# Patient Record
Sex: Male | Born: 1945
Health system: Southern US, Community
[De-identification: ages and names within clinical notes are randomized; demographics above are authoritative.]

## PROBLEM LIST (undated history)

## (undated) DIAGNOSIS — L509 Urticaria, unspecified: Secondary | ICD-10-CM

## (undated) DIAGNOSIS — E079 Disorder of thyroid, unspecified: Secondary | ICD-10-CM

## (undated) DIAGNOSIS — I10 Essential (primary) hypertension: Secondary | ICD-10-CM

## (undated) DIAGNOSIS — K219 Gastro-esophageal reflux disease without esophagitis: Secondary | ICD-10-CM

## (undated) DIAGNOSIS — F1910 Other psychoactive substance abuse, uncomplicated: Secondary | ICD-10-CM

## (undated) HISTORY — DX: Gastro-esophageal reflux disease without esophagitis: K21.9

## (undated) HISTORY — DX: Urticaria, unspecified: L50.9

## (undated) HISTORY — PX: SHOULDER ARTHROSCOPY: SHX128

## (undated) HISTORY — PX: JOINT REPLACEMENT: SHX530

## (undated) HISTORY — DX: Other psychoactive substance abuse, uncomplicated: F19.10

## (undated) HISTORY — PX: TENDON REPAIR: SHX5111

## (undated) HISTORY — PX: CYST EXCISION: SHX5701

## (undated) HISTORY — PX: TONSILLECTOMY: SUR1361

## (undated) HISTORY — PX: ADENOIDECTOMY: SUR15

---

## 1988-03-24 HISTORY — PX: KNEE ARTHROSCOPY: SUR90

## 2000-05-20 ENCOUNTER — Ambulatory Visit (HOSPITAL_COMMUNITY): Admission: RE | Admit: 2000-05-20 | Discharge: 2000-05-20 | Payer: Self-pay | Admitting: Neurology

## 2000-07-07 ENCOUNTER — Ambulatory Visit (HOSPITAL_COMMUNITY): Admission: RE | Admit: 2000-07-07 | Discharge: 2000-07-07 | Payer: Self-pay | Admitting: Neurology

## 2000-07-07 ENCOUNTER — Encounter: Payer: Self-pay | Admitting: Neurology

## 2000-08-24 ENCOUNTER — Ambulatory Visit (HOSPITAL_COMMUNITY): Admission: RE | Admit: 2000-08-24 | Discharge: 2000-08-24 | Payer: Self-pay | Admitting: Pediatrics

## 2000-08-24 ENCOUNTER — Encounter: Payer: Self-pay | Admitting: Pediatrics

## 2001-10-05 ENCOUNTER — Ambulatory Visit (HOSPITAL_COMMUNITY): Admission: RE | Admit: 2001-10-05 | Discharge: 2001-10-05 | Payer: Self-pay | Admitting: Pediatrics

## 2001-10-05 ENCOUNTER — Encounter: Payer: Self-pay | Admitting: Pediatrics

## 2002-03-24 HISTORY — PX: OTHER SURGICAL HISTORY: SHX169

## 2002-07-07 ENCOUNTER — Encounter: Payer: Self-pay | Admitting: Pediatrics

## 2002-07-07 ENCOUNTER — Ambulatory Visit (HOSPITAL_COMMUNITY): Admission: RE | Admit: 2002-07-07 | Discharge: 2002-07-07 | Payer: Self-pay | Admitting: Pediatrics

## 2002-07-08 ENCOUNTER — Encounter: Payer: Self-pay | Admitting: Pediatrics

## 2002-07-08 ENCOUNTER — Ambulatory Visit (HOSPITAL_COMMUNITY): Admission: RE | Admit: 2002-07-08 | Discharge: 2002-07-08 | Payer: Self-pay | Admitting: Pediatrics

## 2002-08-01 ENCOUNTER — Observation Stay (HOSPITAL_COMMUNITY): Admission: RE | Admit: 2002-08-01 | Discharge: 2002-08-02 | Payer: Self-pay | Admitting: General Surgery

## 2015-07-24 ENCOUNTER — Other Ambulatory Visit (HOSPITAL_COMMUNITY): Payer: Self-pay | Admitting: Family Medicine

## 2015-09-17 DIAGNOSIS — R300 Dysuria: Secondary | ICD-10-CM | POA: Diagnosis not present

## 2015-10-01 DIAGNOSIS — R300 Dysuria: Secondary | ICD-10-CM | POA: Diagnosis not present

## 2015-10-01 DIAGNOSIS — R3129 Other microscopic hematuria: Secondary | ICD-10-CM | POA: Diagnosis not present

## 2015-10-01 DIAGNOSIS — R358 Other polyuria: Secondary | ICD-10-CM | POA: Diagnosis not present

## 2015-10-19 DIAGNOSIS — N289 Disorder of kidney and ureter, unspecified: Secondary | ICD-10-CM | POA: Diagnosis not present

## 2015-10-19 DIAGNOSIS — R3129 Other microscopic hematuria: Secondary | ICD-10-CM | POA: Diagnosis not present

## 2015-10-19 DIAGNOSIS — K8020 Calculus of gallbladder without cholecystitis without obstruction: Secondary | ICD-10-CM | POA: Diagnosis not present

## 2015-11-19 DIAGNOSIS — M25512 Pain in left shoulder: Secondary | ICD-10-CM | POA: Diagnosis not present

## 2015-12-07 DIAGNOSIS — N419 Inflammatory disease of prostate, unspecified: Secondary | ICD-10-CM | POA: Diagnosis not present

## 2015-12-07 DIAGNOSIS — R300 Dysuria: Secondary | ICD-10-CM | POA: Diagnosis not present

## 2015-12-07 DIAGNOSIS — T387X5D Adverse effect of androgens and anabolic congeners, subsequent encounter: Secondary | ICD-10-CM | POA: Diagnosis not present

## 2015-12-07 DIAGNOSIS — R358 Other polyuria: Secondary | ICD-10-CM | POA: Diagnosis not present

## 2015-12-07 DIAGNOSIS — N138 Other obstructive and reflux uropathy: Secondary | ICD-10-CM | POA: Diagnosis not present

## 2015-12-07 DIAGNOSIS — N401 Enlarged prostate with lower urinary tract symptoms: Secondary | ICD-10-CM | POA: Diagnosis not present

## 2015-12-07 DIAGNOSIS — E291 Testicular hypofunction: Secondary | ICD-10-CM | POA: Diagnosis not present

## 2015-12-15 DIAGNOSIS — M25512 Pain in left shoulder: Secondary | ICD-10-CM | POA: Diagnosis not present

## 2016-01-11 DIAGNOSIS — R69 Illness, unspecified: Secondary | ICD-10-CM | POA: Diagnosis not present

## 2016-05-19 DIAGNOSIS — M19012 Primary osteoarthritis, left shoulder: Secondary | ICD-10-CM | POA: Diagnosis not present

## 2016-07-22 DIAGNOSIS — M170 Bilateral primary osteoarthritis of knee: Secondary | ICD-10-CM | POA: Diagnosis not present

## 2016-07-30 DIAGNOSIS — L723 Sebaceous cyst: Secondary | ICD-10-CM | POA: Diagnosis not present

## 2016-07-30 DIAGNOSIS — L820 Inflamed seborrheic keratosis: Secondary | ICD-10-CM | POA: Diagnosis not present

## 2016-11-16 DIAGNOSIS — M5416 Radiculopathy, lumbar region: Secondary | ICD-10-CM | POA: Diagnosis not present

## 2016-11-18 DIAGNOSIS — M170 Bilateral primary osteoarthritis of knee: Secondary | ICD-10-CM | POA: Diagnosis not present

## 2016-11-19 DIAGNOSIS — M5416 Radiculopathy, lumbar region: Secondary | ICD-10-CM | POA: Diagnosis not present

## 2016-11-29 DIAGNOSIS — M545 Low back pain: Secondary | ICD-10-CM | POA: Diagnosis not present

## 2016-12-08 DIAGNOSIS — M5416 Radiculopathy, lumbar region: Secondary | ICD-10-CM | POA: Diagnosis not present

## 2016-12-08 DIAGNOSIS — M1712 Unilateral primary osteoarthritis, left knee: Secondary | ICD-10-CM | POA: Diagnosis not present

## 2017-01-02 DIAGNOSIS — M79645 Pain in left finger(s): Secondary | ICD-10-CM | POA: Diagnosis not present

## 2017-01-02 DIAGNOSIS — M1812 Unilateral primary osteoarthritis of first carpometacarpal joint, left hand: Secondary | ICD-10-CM | POA: Diagnosis not present

## 2017-01-09 DIAGNOSIS — M1812 Unilateral primary osteoarthritis of first carpometacarpal joint, left hand: Secondary | ICD-10-CM | POA: Diagnosis not present

## 2017-01-09 DIAGNOSIS — M79645 Pain in left finger(s): Secondary | ICD-10-CM | POA: Diagnosis not present

## 2017-02-09 DIAGNOSIS — M19012 Primary osteoarthritis, left shoulder: Secondary | ICD-10-CM | POA: Diagnosis not present

## 2017-03-15 DIAGNOSIS — M1712 Unilateral primary osteoarthritis, left knee: Secondary | ICD-10-CM | POA: Diagnosis not present

## 2017-03-31 DIAGNOSIS — M170 Bilateral primary osteoarthritis of knee: Secondary | ICD-10-CM | POA: Diagnosis not present

## 2017-04-27 ENCOUNTER — Encounter (HOSPITAL_COMMUNITY): Payer: Self-pay | Admitting: Emergency Medicine

## 2017-04-27 ENCOUNTER — Ambulatory Visit (INDEPENDENT_AMBULATORY_CARE_PROVIDER_SITE_OTHER): Payer: Medicare HMO

## 2017-04-27 ENCOUNTER — Other Ambulatory Visit: Payer: Self-pay

## 2017-04-27 ENCOUNTER — Ambulatory Visit (HOSPITAL_COMMUNITY)
Admission: EM | Admit: 2017-04-27 | Discharge: 2017-04-27 | Disposition: A | Discharge status: Home / Self Care | Payer: Medicare HMO | Attending: Family Medicine | Admitting: Family Medicine

## 2017-04-27 DIAGNOSIS — S20211A Contusion of right front wall of thorax, initial encounter: Secondary | ICD-10-CM

## 2017-04-27 DIAGNOSIS — S2231XA Fracture of one rib, right side, initial encounter for closed fracture: Secondary | ICD-10-CM | POA: Diagnosis not present

## 2017-04-27 DIAGNOSIS — W19XXXD Unspecified fall, subsequent encounter: Secondary | ICD-10-CM | POA: Diagnosis not present

## 2017-04-27 HISTORY — DX: Essential (primary) hypertension: I10

## 2017-04-27 HISTORY — DX: Disorder of thyroid, unspecified: E07.9

## 2017-04-27 NOTE — ED Triage Notes (Signed)
The patient presented to the [facility] with a complaint of right lateral posterior rib cage pain secondary to a fall x 3 weeks ago.

## 2017-04-27 NOTE — ED Provider Notes (Signed)
  [place]   [id number] 04/27/17 Arrival Time: 1005  ASSESSMENT & PLAN:  1. Rib contusion, right, initial encounter   2. Closed fracture of one rib of right side, initial encounter    Imaging: Dg Ribs Unilateral W/chest Right  Result Date: 04/27/2017 CLINICAL DATA:  Fall in shower 3 weeks ago EXAM: RIGHT RIBS AND CHEST - 3+ VIEW COMPARISON:  12/15/2015 FINDINGS: Right lateral 8th rib fracture noted. No effusion or pneumothorax. Heart is normal size. Lungs are clear. IMPRESSION: Right lateral 8th rib fracture.  No pneumothorax. Electronically Signed   By: Rolm Baptise M.D.   On: 04/27/2017 10:43   He reports adquate pain control with OTC analgesics. Should continue to improve over the next 2-3 weeks.  May f/u as needed.  Reviewed expectations re: course of current medical issues. Questions answered. Outlined signs and symptoms indicating need for more acute intervention. Patient verbalized understanding. After Visit Summary given.  SUBJECTIVE: History from: patient. ILYA NEELY is a 72 y.o. male who reports:  Reports  localized pain of his right posterior ribcage; persistent; described as aching and sharp without radiation Onset: abrupt, 3 weeks ago Injury/trama: yes, fall onto side of tub hitting R posterior ribs Severity: moderate Progression: stable Relieved by: OTC ibuprofen Worsened by: certain movements Associated symptoms: "feels a little swollen over the place I hit" Extremity sensation changes or weakness: none Self treatment: tried OTCs with relief of pain No SOB.  ROS: As per HPI.   OBJECTIVE:  Vitals:   04/27/17 1020  BP: (!) 164/85  Pulse: 84  Resp: 18  Temp: 98.7 F (37.1 C)  TempSrc: Oral  SpO2: 100%    General appearance: alert; no distress Extremities: no cyanosis or edema CV: normal extremity capillary refill Chest wall: tender over lower R post ribs; no gross abn; no bruising Skin: warm and dry Neurologic: normal  gait Psychological: alert and cooperative; normal mood and affect  Allergies  Allergen Reactions  . Codeine Other (See Comments)    Syncope    Past Medical History:  Diagnosis Date  . Hypertension   . Thyroid disease    Social History   Socioeconomic History  . Marital status: Single    Spouse name: Not on file  . Number of children: Not on file  . Years of education: Not on file  . Highest education level: Not on file  Social Needs  . Financial resource strain: Not on file  . Food insecurity - worry: Not on file  . Food insecurity - inability: Not on file  . Transportation needs - medical: Not on file  . Transportation needs - non-medical: Not on file  Occupational History  . Not on file  Tobacco Use  . Smoking status: Former Research scientist (life sciences)  . Smokeless tobacco: Never Used  Substance and Sexual Activity  . Alcohol use: No    Frequency: Never  . Drug use: No  . Sexual activity: Not on file  Other Topics Concern  . Not on file  Social History Narrative  . Not on file   History reviewed. No pertinent family history. Past Surgical History:  Procedure Laterality Date  . CYST EXCISION    . JOINT REPLACEMENT    . SHOULDER ARTHROSCOPY    . TENDON REPAIR    . Evonnie Dawes, MD 04/27/17 1128

## 2017-06-10 DIAGNOSIS — M25511 Pain in right shoulder: Secondary | ICD-10-CM | POA: Diagnosis not present

## 2017-07-31 DIAGNOSIS — M542 Cervicalgia: Secondary | ICD-10-CM | POA: Diagnosis not present

## 2017-07-31 DIAGNOSIS — M25511 Pain in right shoulder: Secondary | ICD-10-CM | POA: Diagnosis not present

## 2017-08-11 DIAGNOSIS — M170 Bilateral primary osteoarthritis of knee: Secondary | ICD-10-CM | POA: Diagnosis not present

## 2017-08-20 ENCOUNTER — Other Ambulatory Visit: Payer: Self-pay

## 2017-08-20 ENCOUNTER — Encounter: Payer: Self-pay | Admitting: Emergency Medicine

## 2017-08-20 ENCOUNTER — Emergency Department
Admission: EM | Admit: 2017-08-20 | Discharge: 2017-08-20 | Disposition: A | Discharge status: Home / Self Care | Payer: Medicare HMO | Source: Home / Self Care

## 2017-08-20 DIAGNOSIS — W57XXXA Bitten or stung by nonvenomous insect and other nonvenomous arthropods, initial encounter: Secondary | ICD-10-CM | POA: Diagnosis not present

## 2017-08-20 DIAGNOSIS — L03115 Cellulitis of right lower limb: Secondary | ICD-10-CM | POA: Diagnosis not present

## 2017-08-20 MED ORDER — DOXYCYCLINE HYCLATE 100 MG PO TABS: 100.0000 mg | ORAL_TABLET | Freq: Two times a day (BID) | ORAL | 0 refills | Status: DC

## 2017-08-20 NOTE — ED Provider Notes (Signed)
[place]   [id number] 08/20/17 Arrival Time: 7   SUBJECTIVE:  SYNCERE EBLE is a 72 y.o. male who presents to the urgent care with complaint of Tick bite base of skull, bite on top of head, skin problem on Right knee x 2 weeks. Headache this am, resolved No other rash or arthralgia Using neosporin and bacitracin on the abrasion on the right knee that just hasn't healed and is more itchy than painful.  Works in psychiatry downstairs.  Past Medical History:  Diagnosis Date  . Hypertension   . Thyroid disease    Family History  Problem Relation Age of Onset  . Cancer Father   . Diabetes Father   . Hypertension Father   . Heart attack Father    Social History   Socioeconomic History  . Marital status: Single    Spouse name: Not on file  . Number of children: Not on file  . Years of education: Not on file  . Highest education level: Not on file  Occupational History  . Not on file  Social Needs  . Financial resource strain: Not on file  . Food insecurity:    Worry: Not on file    Inability: Not on file  . Transportation needs:    Medical: Not on file    Non-medical: Not on file  Tobacco Use  . Smoking status: Former Research scientist (life sciences)  . Smokeless tobacco: Never Used  Substance and Sexual Activity  . Alcohol use: No    Frequency: Never  . Drug use: No  . Sexual activity: Not on file  Lifestyle  . Physical activity:    Days per week: Not on file    Minutes per session: Not on file  . Stress: Not on file  Relationships  . Social connections:    Talks on phone: Not on file    Gets together: Not on file    Attends religious service: Not on file    Active member of club or organization: Not on file    Attends meetings of clubs or organizations: Not on file    Relationship status: Not on file  . Intimate partner violence:    Fear of current or ex partner: Not on file    Emotionally abused: Not on file    Physically abused: Not on file    Forced sexual  activity: Not on file  Other Topics Concern  . Not on file  Social History Narrative  . Not on file   No outpatient medications have been marked as taking for the 08/20/17 encounter [facility] Encounter).   Allergies  Allergen Reactions  . Codeine Other (See Comments)    Syncope      ROS: As per HPI, remainder of ROS negative.   OBJECTIVE:   Vitals:   08/20/17 1251 08/20/17 1253  BP: (!) 168/89   Pulse: 77   Temp: 98.5 F (36.9 C)   TempSrc: Oral   SpO2: 96%   Weight:  211 lb (95.7 kg)  Height:  5\' 11"  (1.803 m)     General appearance: alert; no distress Eyes: PERRL; EOMI; conjunctiva normal HENT: normocephalic; atraumatic; TMs normal, canal normal, external ears normal without trauma; nasal mucosa normal; oral mucosa normal Neck: supple Extremities: no cyanosis or edema; symmetrical with no gross deformities Skin: warm and dry; crusty 1/2 cm right occipital nodule where the tick was removed. The right knee has a central yellow eschar of 2 x 3 mm with 1 cm surrounding erythema  Neurologic: normal gait; grossly normal Psychological: alert and cooperative; normal mood and affect       ASSESSMENT & PLAN:  1. Tick bite, initial encounter   2. Cellulitis of right lower extremity     Meds ordered this encounter  Medications  . doxycycline (VIBRA-TABS) 100 MG tablet    Sig: Take 1 tablet (100 mg total) by mouth 2 (two) times daily.    Dispense:  20 tablet    Refill:  0    Reviewed expectations re: course of current medical issues. Questions answered. Outlined signs and symptoms indicating need for more acute intervention. Patient verbalized understanding. After Visit Summary given.     Robyn Haber, MD 08/20/17 1340

## 2017-08-20 NOTE — ED Triage Notes (Addendum)
Tick bite base of skull, bite on top of head, skin problem on Right knee x 2 days Headache this am, resolved

## 2017-08-31 DIAGNOSIS — M25511 Pain in right shoulder: Secondary | ICD-10-CM | POA: Diagnosis not present

## 2017-08-31 DIAGNOSIS — M542 Cervicalgia: Secondary | ICD-10-CM | POA: Diagnosis not present

## 2017-09-04 DIAGNOSIS — M542 Cervicalgia: Secondary | ICD-10-CM | POA: Diagnosis not present

## 2017-10-06 DIAGNOSIS — M170 Bilateral primary osteoarthritis of knee: Secondary | ICD-10-CM | POA: Diagnosis not present

## 2017-10-20 DIAGNOSIS — M542 Cervicalgia: Secondary | ICD-10-CM | POA: Diagnosis not present

## 2017-10-20 DIAGNOSIS — M5412 Radiculopathy, cervical region: Secondary | ICD-10-CM | POA: Diagnosis not present

## 2017-10-30 DIAGNOSIS — M5412 Radiculopathy, cervical region: Secondary | ICD-10-CM | POA: Diagnosis not present

## 2017-10-30 DIAGNOSIS — M542 Cervicalgia: Secondary | ICD-10-CM | POA: Diagnosis not present

## 2017-11-08 ENCOUNTER — Ambulatory Visit (HOSPITAL_COMMUNITY)
Admission: EM | Admit: 2017-11-08 | Discharge: 2017-11-08 | Disposition: A | Discharge status: Home / Self Care | Payer: Medicare HMO | Attending: Family Medicine | Admitting: Family Medicine

## 2017-11-08 ENCOUNTER — Encounter (HOSPITAL_COMMUNITY): Payer: Self-pay

## 2017-11-08 DIAGNOSIS — L089 Local infection of the skin and subcutaneous tissue, unspecified: Secondary | ICD-10-CM

## 2017-11-08 DIAGNOSIS — L723 Sebaceous cyst: Secondary | ICD-10-CM | POA: Diagnosis not present

## 2017-11-08 MED ORDER — LIDOCAINE-EPINEPHRINE (PF) 2 %-1:200000 IJ SOLN
INTRAMUSCULAR | Status: AC
  Filled 2017-11-08: qty 20

## 2017-11-08 MED ORDER — DOXYCYCLINE HYCLATE 100 MG PO TABS: 100.0000 mg | ORAL_TABLET | Freq: Two times a day (BID) | ORAL | 0 refills | Status: DC

## 2017-11-08 NOTE — ED Triage Notes (Signed)
Pt presents with abscess on back of right shoulder area.

## 2017-11-08 NOTE — Discharge Instructions (Signed)
Warm compresses to area Change dressing daily Remove wick in a couple days Take antibiotics as directed Return as needed

## 2017-11-08 NOTE — ED Provider Notes (Signed)
[name]    CSN: [id number] Arrival date & time: 11/08/17  1544     History   Chief Complaint Chief Complaint  Patient presents with  . Abscess    right shoulder area on back     HPI Daniel Marquez is a 72 y.o. male.   HPI  Patient has a known swollen cyst on the right shoulder area.  It is become more more swollen and painful.  Now he thinks there is an abscess because it surrounded by red skin and very painful.  No fever.  Past Medical History:  Diagnosis Date  . Hypertension   . Thyroid disease     There are no active problems to display for this patient.   Past Surgical History:  Procedure Laterality Date  . CYST EXCISION    . JOINT REPLACEMENT    . SHOULDER ARTHROSCOPY    . TENDON REPAIR    . TONSILLECTOMY         Home Medications    Prior to Admission medications   Medication Sig Start Date End Date Taking? Authorizing Provider  buprenorphine (SUBUTEX) 8 MG SUBL SL tablet Place under the tongue daily.    [provider]  doxycycline (VIBRA-TABS) 100 MG tablet Take 1 tablet (100 mg total) by mouth 2 (two) times daily. 11/08/17   Raylene Everts, MD  hydrALAZINE (APRESOLINE) 10 MG tablet Take 10 mg by mouth 3 (three) times daily.    [provider]  levothyroxine (SYNTHROID, LEVOTHROID) 25 MCG tablet Take 25 mcg by mouth daily before breakfast.    [provider]  lisinopril (PRINIVIL,ZESTRIL) 10 MG tablet Take 10 mg by mouth daily.    [provider]    Family History Family History  Problem Relation Age of Onset  . Cancer Father   . Diabetes Father   . Hypertension Father   . Heart attack Father     Social History Social History   Tobacco Use  . Smoking status: Former Research scientist (life sciences)  . Smokeless tobacco: Never Used  Substance Use Topics  . Alcohol use: No    Frequency: Never  . Drug use: No     Allergies   Codeine   Review of Systems Review of Systems  Constitutional: Negative for  chills and fever.  HENT: Negative for ear pain and sore throat.   Eyes: Negative for pain and visual disturbance.  Respiratory: Negative for cough and shortness of breath.   Cardiovascular: Negative for chest pain and palpitations.  Gastrointestinal: Negative for abdominal pain and vomiting.  Genitourinary: Negative for dysuria and hematuria.  Musculoskeletal: Negative for arthralgias and back pain.  Skin: Positive for wound. Negative for color change and rash.  Neurological: Negative for seizures and syncope.  All other systems reviewed and are negative.    Physical Exam Triage Vital Signs ED Triage Vitals  Enc Vitals Group     BP 11/08/17 1555 137/71     Pulse Rate 11/08/17 1555 84     Resp 11/08/17 1555 20     Temp 11/08/17 1555 98 F (36.7 C)     Temp Source 11/08/17 1555 Temporal     SpO2 11/08/17 1555 97 %     Weight --      Height --      Head Circumference --      Peak Flow --      Pain Score 11/08/17 1554 8     Pain Loc --  Pain Edu? --      Excl. in [place]? --    No data found.  Updated Vital Signs BP 137/71 (BP Location: Left Arm)   Pulse 84   Temp 98 F (36.7 C) (Temporal)   Resp 20   SpO2 97%   Visual Acuity Right Eye Distance:   Left Eye Distance:   Bilateral Distance:    Right Eye Near:   Left Eye Near:    Bilateral Near:     Physical Exam  Constitutional: He appears well-developed and well-nourished. No distress.  HENT:  Head: Normocephalic and atraumatic.  Mouth/Throat: Oropharynx is clear and moist.  Eyes: Pupils are equal, round, and reactive to light. Conjunctivae are normal.  Neck: Normal range of motion.  Cardiovascular: Normal rate.  Pulmonary/Chest: Effort normal. No respiratory distress.  Abdominal: Soft. He exhibits no distension.  Musculoskeletal: Normal range of motion. He exhibits no edema.       Back:  Neurological: He is alert.  Skin: Skin is warm and dry.  Psychiatric: He has a normal mood and affect. His behavior is  normal.     UC Treatments / Results  Labs (all labs ordered are listed, but only abnormal results are displayed) Labs Reviewed - No data to display  EKG None  Radiology No results found.  Procedures Incision and Drainage Date/Time: 11/08/2017 7:55 PM Performed by: Raylene Everts, MD Authorized by: Raylene Everts, MD   Consent:    Consent obtained:  Verbal   Consent given by:  Patient   Risks discussed:  Bleeding, incomplete drainage and infection   Alternatives discussed:  No treatment Location:    Type:  Abscess   Size:  4 cm   Location:  Trunk   Trunk location:  Back Pre-procedure details:    Skin preparation:  Betadine Anesthesia (see MAR for exact dosages):    Anesthesia method:  Local infiltration   Local anesthetic:  Lidocaine 2% WITH epi Procedure type:    Complexity:  Complex Procedure details:    Needle aspiration: no     Incision types:  Stab incision   Incision depth:  Subcutaneous   Scalpel blade:  11   Wound management:  Probed and deloculated and irrigated with saline   Drainage:  Purulent   Drainage amount:  Copious   Wound treatment:  Drain placed   Packing materials:  1/4 in gauze Post-procedure details:    Patient tolerance of procedure:  Tolerated well, no immediate complications Comments:     Moderate purulence expressed, also moderate see above.  Walls of cyst were visible and were also debrided away.  Gated and then packed with 8 inches of quarter inch gauze   (including critical care time)  Medications Ordered in UC Medications - No data to display  Initial Impression / Assessment and Plan / UC Course  I have reviewed the triage vital signs and the nursing notes.  Pertinent labs & imaging results that were available during my care of the patient were reviewed by me and considered in my medical decision making (see chart for details).     Infected sebaceous cyst.  Discussed care with patient.  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  He can come back here for any difficulty. Final Clinical Impressions(s) / UC Diagnoses   Final diagnoses:  Infected sebaceous cyst of skin     Discharge Instructions     Warm compresses to area Change dressing daily  Remove wick in a couple days Take antibiotics as directed Return as needed   ED Prescriptions    Medication Sig Dispense Auth. Provider   doxycycline (VIBRA-TABS) 100 MG tablet Take 1 tablet (100 mg total) by mouth 2 (two) times daily. 20 tablet Raylene Everts, MD     Controlled Substance Prescriptions [place] Controlled Substance Registry consulted? Not Applicable   Raylene Everts, MD 11/08/17 [phone]

## 2017-11-17 DIAGNOSIS — M4316 Spondylolisthesis, lumbar region: Secondary | ICD-10-CM | POA: Diagnosis not present

## 2017-11-17 DIAGNOSIS — M545 Low back pain: Secondary | ICD-10-CM | POA: Diagnosis not present

## 2017-11-17 DIAGNOSIS — M5416 Radiculopathy, lumbar region: Secondary | ICD-10-CM | POA: Diagnosis not present

## 2017-11-27 DIAGNOSIS — M4316 Spondylolisthesis, lumbar region: Secondary | ICD-10-CM | POA: Diagnosis not present

## 2017-11-27 DIAGNOSIS — M5416 Radiculopathy, lumbar region: Secondary | ICD-10-CM | POA: Diagnosis not present

## 2017-11-27 DIAGNOSIS — M545 Low back pain: Secondary | ICD-10-CM | POA: Diagnosis not present

## 2017-12-08 DIAGNOSIS — R69 Illness, unspecified: Secondary | ICD-10-CM | POA: Diagnosis not present

## 2017-12-18 DIAGNOSIS — M48062 Spinal stenosis, lumbar region with neurogenic claudication: Secondary | ICD-10-CM | POA: Insufficient documentation

## 2017-12-18 DIAGNOSIS — M4180 Other forms of scoliosis, site unspecified: Secondary | ICD-10-CM | POA: Insufficient documentation

## 2017-12-22 DIAGNOSIS — M5416 Radiculopathy, lumbar region: Secondary | ICD-10-CM | POA: Diagnosis not present

## 2017-12-22 DIAGNOSIS — M4316 Spondylolisthesis, lumbar region: Secondary | ICD-10-CM | POA: Diagnosis not present

## 2017-12-22 DIAGNOSIS — M5412 Radiculopathy, cervical region: Secondary | ICD-10-CM | POA: Diagnosis not present

## 2018-01-08 DIAGNOSIS — M4316 Spondylolisthesis, lumbar region: Secondary | ICD-10-CM | POA: Diagnosis not present

## 2018-01-08 DIAGNOSIS — M545 Low back pain: Secondary | ICD-10-CM | POA: Diagnosis not present

## 2018-01-08 DIAGNOSIS — M5416 Radiculopathy, lumbar region: Secondary | ICD-10-CM | POA: Diagnosis not present

## 2018-03-01 DIAGNOSIS — M5416 Radiculopathy, lumbar region: Secondary | ICD-10-CM | POA: Diagnosis not present

## 2018-03-01 DIAGNOSIS — M545 Low back pain: Secondary | ICD-10-CM | POA: Diagnosis not present

## 2018-03-01 DIAGNOSIS — M4316 Spondylolisthesis, lumbar region: Secondary | ICD-10-CM | POA: Diagnosis not present

## 2018-04-19 DIAGNOSIS — M1712 Unilateral primary osteoarthritis, left knee: Secondary | ICD-10-CM | POA: Diagnosis not present

## 2018-04-20 DIAGNOSIS — E039 Hypothyroidism, unspecified: Secondary | ICD-10-CM | POA: Diagnosis not present

## 2018-04-20 DIAGNOSIS — E663 Overweight: Secondary | ICD-10-CM | POA: Diagnosis not present

## 2018-04-20 DIAGNOSIS — G8929 Other chronic pain: Secondary | ICD-10-CM | POA: Insufficient documentation

## 2018-04-20 DIAGNOSIS — F1121 Opioid dependence, in remission: Secondary | ICD-10-CM | POA: Insufficient documentation

## 2018-04-20 DIAGNOSIS — N400 Enlarged prostate without lower urinary tract symptoms: Secondary | ICD-10-CM | POA: Insufficient documentation

## 2018-04-20 DIAGNOSIS — M1712 Unilateral primary osteoarthritis, left knee: Secondary | ICD-10-CM | POA: Diagnosis not present

## 2018-04-20 DIAGNOSIS — N401 Enlarged prostate with lower urinary tract symptoms: Secondary | ICD-10-CM | POA: Diagnosis not present

## 2018-04-20 DIAGNOSIS — E291 Testicular hypofunction: Secondary | ICD-10-CM | POA: Diagnosis not present

## 2018-04-20 DIAGNOSIS — F1021 Alcohol dependence, in remission: Secondary | ICD-10-CM | POA: Diagnosis not present

## 2018-04-20 DIAGNOSIS — I10 Essential (primary) hypertension: Secondary | ICD-10-CM | POA: Diagnosis not present

## 2018-04-22 DIAGNOSIS — F324 Major depressive disorder, single episode, in partial remission: Secondary | ICD-10-CM | POA: Insufficient documentation

## 2018-04-22 DIAGNOSIS — M7670 Peroneal tendinitis, unspecified leg: Secondary | ICD-10-CM | POA: Insufficient documentation

## 2018-05-28 ENCOUNTER — Other Ambulatory Visit: Payer: Self-pay | Admitting: Orthopedic Surgery

## 2018-06-07 DIAGNOSIS — M5412 Radiculopathy, cervical region: Secondary | ICD-10-CM | POA: Insufficient documentation

## 2018-06-07 DIAGNOSIS — M4310 Spondylolisthesis, site unspecified: Secondary | ICD-10-CM | POA: Insufficient documentation

## 2018-06-07 DIAGNOSIS — M25519 Pain in unspecified shoulder: Secondary | ICD-10-CM | POA: Insufficient documentation

## 2018-06-22 ENCOUNTER — Encounter (HOSPITAL_COMMUNITY): Payer: Medicare HMO

## 2018-06-29 ENCOUNTER — Ambulatory Visit: Admit: 2018-06-29 | Payer: Medicare HMO | Admitting: Orthopedic Surgery

## 2018-06-29 SURGERY — ARTHROPLASTY, KNEE, UNICOMPARTMENTAL
Anesthesia: Choice | Laterality: Left

## 2018-12-03 DIAGNOSIS — R413 Other amnesia: Secondary | ICD-10-CM | POA: Insufficient documentation

## 2018-12-07 DIAGNOSIS — N419 Inflammatory disease of prostate, unspecified: Secondary | ICD-10-CM | POA: Insufficient documentation

## 2019-03-29 IMAGING — DX DG RIBS W/ CHEST 3+V*R*
3 series · 3 of 3 positions shown · non-contrast
Comparison: 12/15/2015

CLINICAL DATA: Fall in shower 3 weeks ago

EXAM:
RIGHT RIBS AND CHEST - 3+ VIEW

[chest pa]
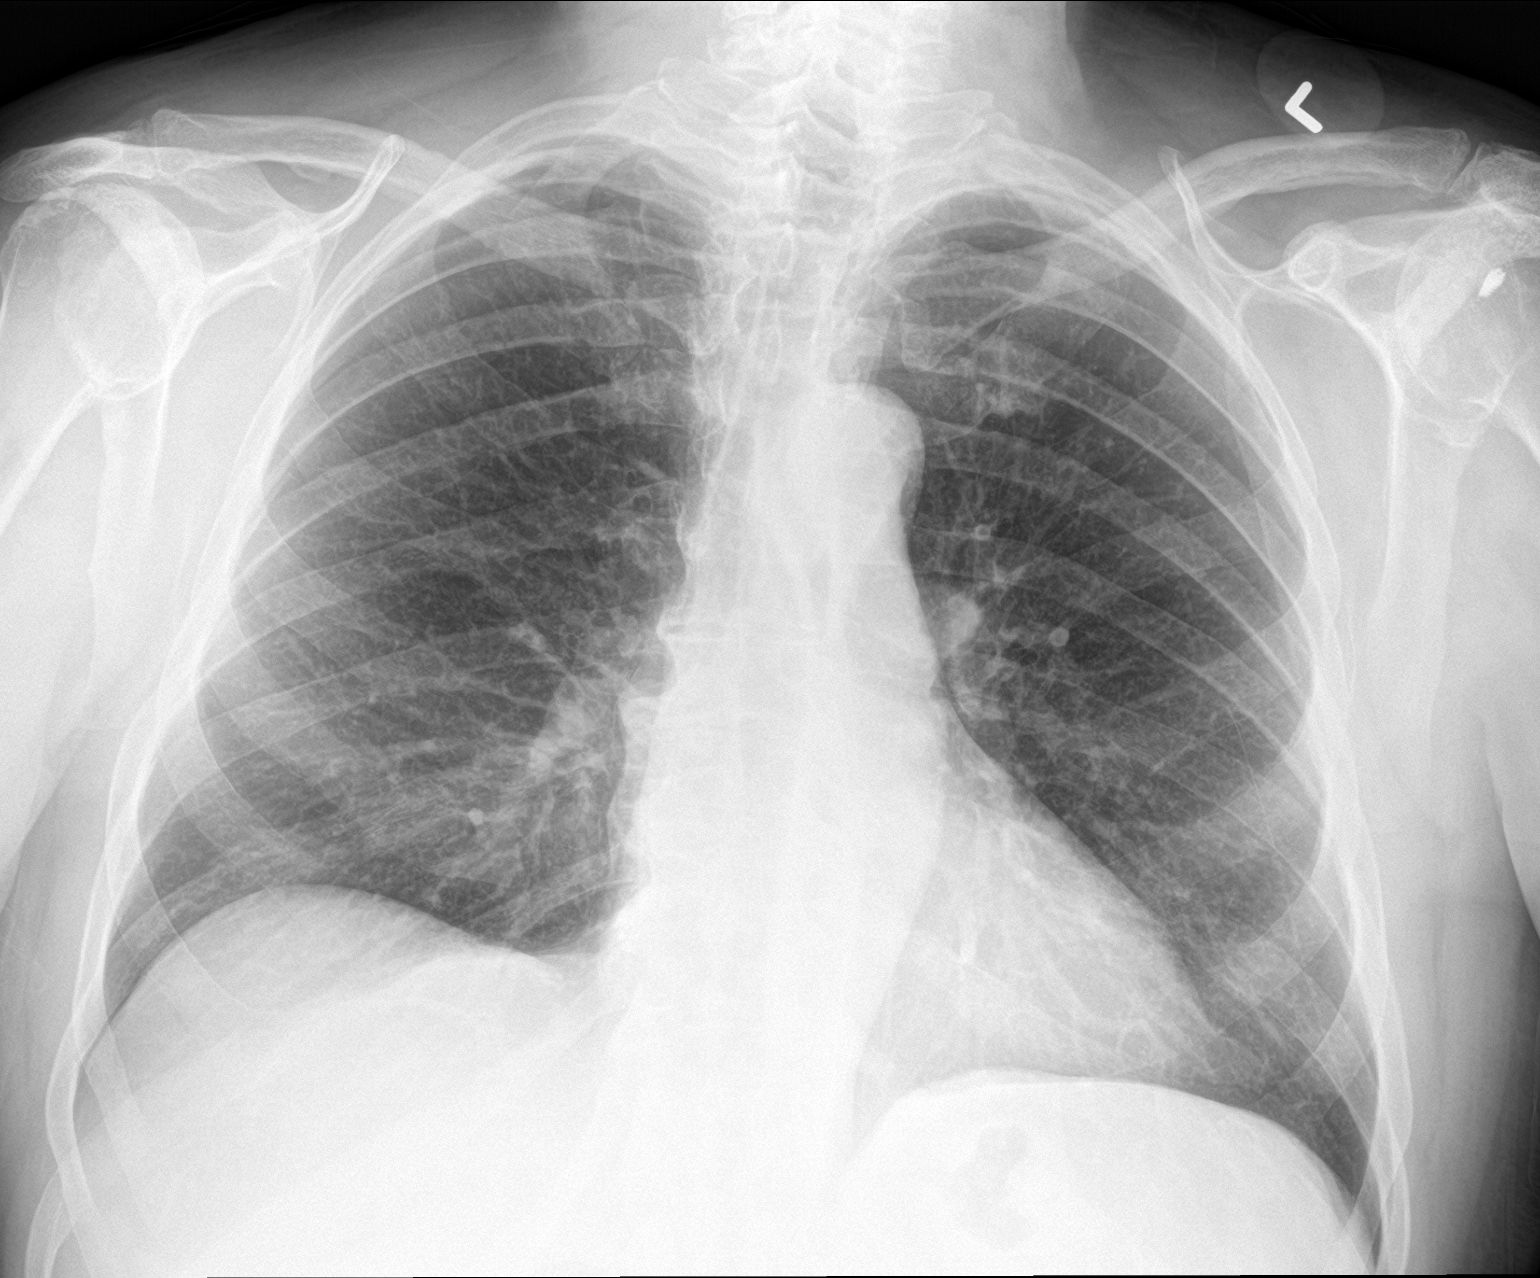

[rib pa]
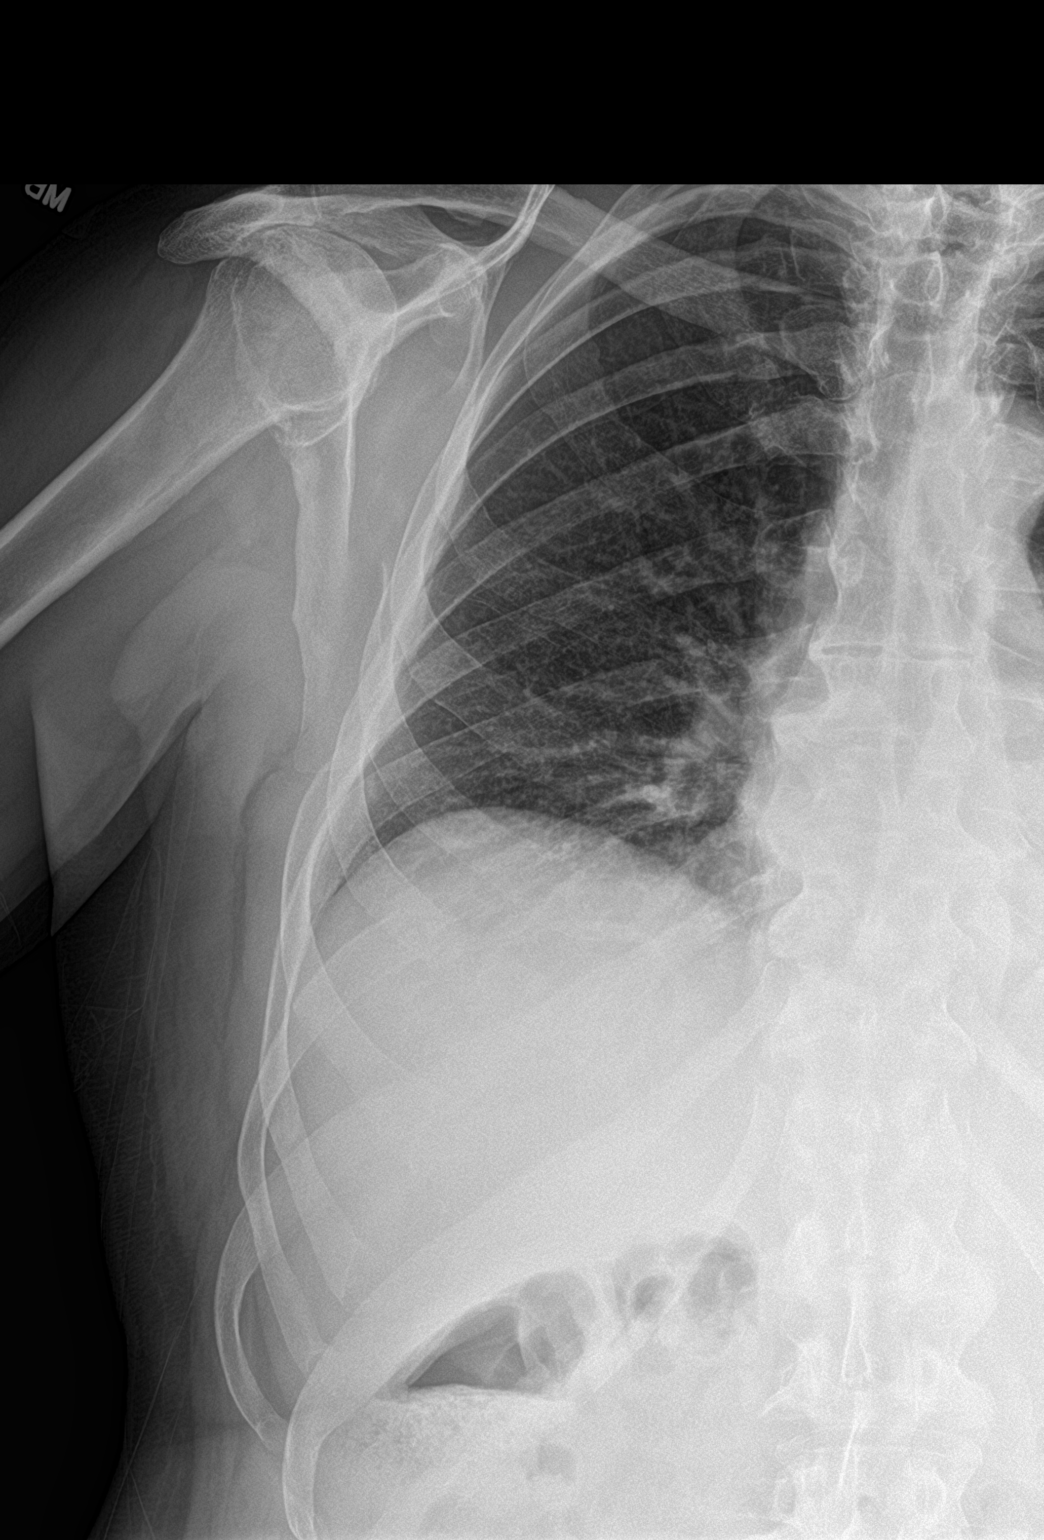

[rib obl]
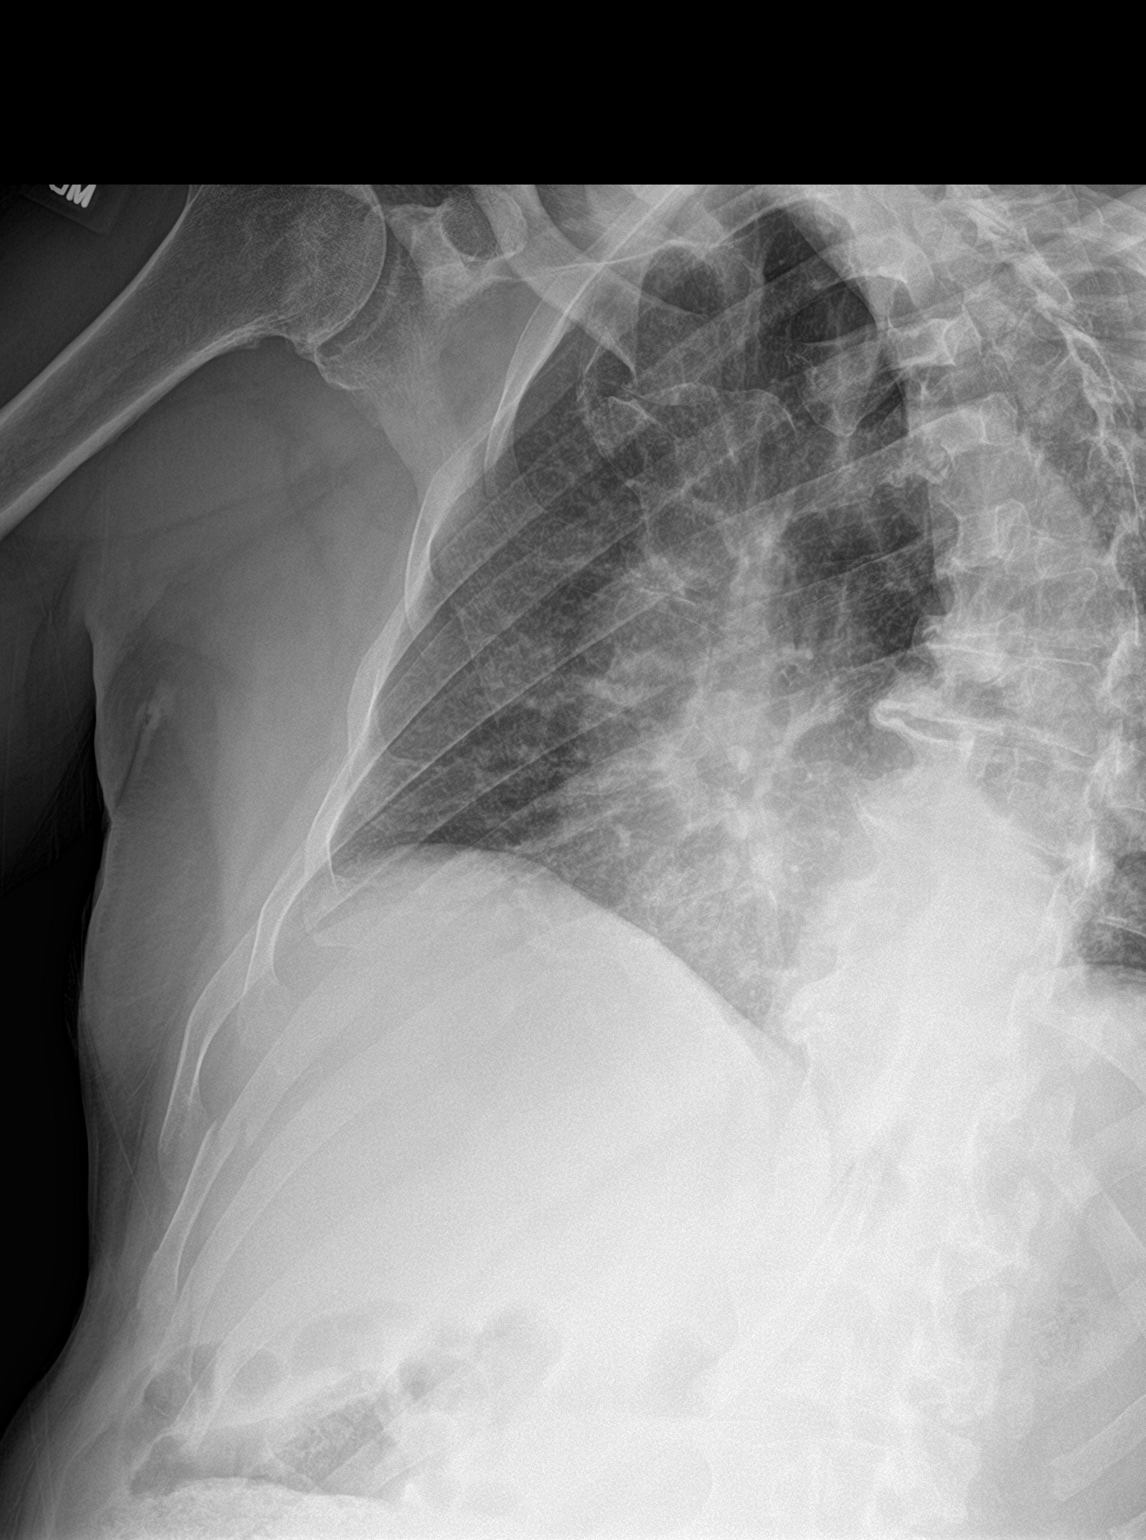

[3 of 3 positions shown; findings below may reference images not displayed]

FINDINGS: Right lateral 8th rib fracture noted. No effusion or pneumothorax.
Heart is normal size. Lungs are clear.
IMPRESSION: Right lateral 8th rib fracture.  No pneumothorax.

## 2019-04-01 DIAGNOSIS — R252 Cramp and spasm: Secondary | ICD-10-CM | POA: Insufficient documentation

## 2019-11-08 DIAGNOSIS — M545 Low back pain, unspecified: Secondary | ICD-10-CM | POA: Insufficient documentation

## 2019-11-08 DIAGNOSIS — R7989 Other specified abnormal findings of blood chemistry: Secondary | ICD-10-CM | POA: Insufficient documentation

## 2019-12-30 DIAGNOSIS — R21 Rash and other nonspecific skin eruption: Secondary | ICD-10-CM | POA: Insufficient documentation

## 2020-05-07 DIAGNOSIS — M238X9 Other internal derangements of unspecified knee: Secondary | ICD-10-CM | POA: Insufficient documentation

## 2020-05-15 DIAGNOSIS — H6990 Unspecified Eustachian tube disorder, unspecified ear: Secondary | ICD-10-CM | POA: Insufficient documentation

## 2020-10-26 LAB — PSA: PSA: 1.11

## 2020-11-20 DIAGNOSIS — M1712 Unilateral primary osteoarthritis, left knee: Secondary | ICD-10-CM | POA: Diagnosis present

## 2020-11-20 DIAGNOSIS — I440 Atrioventricular block, first degree: Secondary | ICD-10-CM | POA: Insufficient documentation

## 2020-11-21 NOTE — Patient Instructions (Signed)
DUE TO COVID-19 ONLY ONE VISITOR IS ALLOWED TO COME WITH YOU AND STAY IN THE WAITING ROOM ONLY DURING PRE OP AND PROCEDURE.   **NO VISITORS ARE ALLOWED IN THE SHORT STAY AREA OR RECOVERY ROOM!!**  IF YOU WILL BE ADMITTED INTO THE HOSPITAL YOU ARE ALLOWED ONLY TWO SUPPORT PEOPLE DURING VISITATION HOURS ONLY (10AM -8PM)   The support person(s) may change daily. The support person(s) must pass our screening, gel in and out, and wear a mask at all times, including in the patient's room. Patients must also wear a mask when staff or their support person are in the room.  No visitors under the age of 93. Any visitor under the age of 11 must be accompanied by an adult.   COVID SWAB TESTING MUST BE COMPLETED ON:  Friday 11-23-20 Between the hours of 8 and 3  **MUST PRESENT COMPLETED FORM AT TESTING SITE**    [facility] [place] (backside of the building)  You are not required to quarantine, however you are required to wear a well-fitted mask when you are out and around people not in your household.  Hand Hygiene often Do NOT share personal items Notify your provider if you are in close contact with someone who has COVID or you develop fever 100.4 or greater, new onset of sneezing, cough, sore throat, shortness of breath or body aches.         Your procedure is scheduled on:  Tuesday, 11-27-20   Report to [facility] Main  Entrance   Report to Short Stay at 5:15 AM   [facility])   Call this number if you have problems the morning of surgery [phone]   Do not eat food :After Midnight.   May have liquids until 4:30 AM day of surgery  CLEAR LIQUID DIET  Foods Allowed                                                                     Foods Excluded  Water, Black Coffee (no milk/no creamer) and tea, regular and decaf                              liquids that you cannot  Plain Jell-O in any flavor  (No red)                         see through such as: Fruit ices  (not with fruit pulp)                                 milk, soups, orange juice  Iced Popsicles (No red)                                    All solid food                             Apple juices Sports drinks like Gatorade (No red) Lightly seasoned clear broth or consume(fat free) Sugar  Complete one Ensure drink the morning of surgery at  4:30 AM the day of surgery.        The day of surgery:  Drink ONE (1) Pre-Surgery Clear Ensure the morning of surgery. Drink in one sitting. Do not sip.  This drink was given to you during your hospital  pre-op appointment visit. Nothing else to drink after completing the Pre-Surgery Clear Ensure           If you have questions, please contact your surgeon's office.     Oral Hygiene is also important to reduce your risk of infection.                                    Remember - BRUSH YOUR TEETH THE MORNING OF SURGERY WITH YOUR REGULAR TOOTHPASTE   Do NOT smoke after Midnight   Take these medicines the morning of surgery with A SIP OF WATER:  Hydralazine, Levothyroxine             You may not have any metal on your body including  jewelry, and body piercing             Do not wear lotions, powders, cologne, or deodorant              Men may shave face and neck.  Do not bring valuables to the hospital. [place].   Contacts, dentures or bridgework may not be worn into surgery.   Bring small overnight bag day of surgery.   Special Instructions: Bring a copy of your healthcare power of attorney and living will documents the day of surgery if you haven't scanned them in before.  Please read over the following fact sheets you were given: IF YOU HAVE QUESTIONS ABOUT YOUR PRE OP INSTRUCTIONS PLEASE CALL [phone]   [place] - Preparing for Surgery Before surgery, you can play an important role.  Because skin is not sterile, your skin needs to be as free of germs as possible.  You can reduce the number  of germs on your skin by washing with CHG (chlorahexidine gluconate) soap before surgery.  CHG is an antiseptic cleaner which kills germs and bonds with the skin to continue killing germs even after washing. Please DO NOT use if you have an allergy to CHG or antibacterial soaps.  If your skin becomes reddened/irritated stop using the CHG and inform your nurse when you arrive at Short Stay. Do not shave (including legs and underarms) for at least 48 hours prior to the first CHG shower.  You may shave your face/neck.  Please follow these instructions carefully:  1.  Shower with CHG Soap the night before surgery and the  morning of surgery.  2.  If you choose to wash your hair, wash your hair first as usual with your normal  shampoo.  3.  After you shampoo, rinse your hair and body thoroughly to remove the shampoo.                             4.  Use CHG as you would any other liquid soap.  You can apply chg directly to the skin and wash.  Gently with a scrungie or clean washcloth.  5.  Apply the CHG Soap to your body ONLY FROM THE NECK DOWN.   Do   not  use on face/ open                           Wound or open sores. Avoid contact with eyes, ears mouth and   genitals (private parts).                       Wash face,  Genitals (private parts) with your normal soap.             6.  Wash thoroughly, paying special attention to the area where your    surgery  will be performed.  7.  Thoroughly rinse your body with warm water from the neck down.  8.  DO NOT shower/wash with your normal soap after using and rinsing off the CHG Soap.                9.  Pat yourself dry with a clean towel.            10.  Wear clean pajamas.            11.  Place clean sheets on your bed the night of your first shower and do not  sleep with pets. Day of Surgery : Do not apply any lotions/deodorants the morning of surgery.  Please wear clean clothes to the hospital/surgery center.  FAILURE TO FOLLOW THESE INSTRUCTIONS MAY  RESULT IN THE CANCELLATION OF YOUR SURGERY  PATIENT SIGNATURE_________________________________  NURSE SIGNATURE__________________________________  ________________________________________________________________________   Daniel Marquez  An incentive spirometer is a tool that can help keep your lungs clear and active. This tool measures how well you are filling your lungs with each breath. Taking long deep breaths may help reverse or decrease the chance of developing breathing (pulmonary) problems (especially infection) following: A long period of time when you are unable to move or be active. BEFORE THE PROCEDURE  If the spirometer includes an indicator to show your best effort, your nurse or respiratory therapist will set it to a desired goal. If possible, sit up straight or lean slightly forward. Try not to slouch. Hold the incentive spirometer in an upright position. INSTRUCTIONS FOR USE  Sit on the edge of your bed if possible, or sit up as far as you can in bed or on a chair. Hold the incentive spirometer in an upright position. Breathe out normally. Place the mouthpiece in your mouth and seal your lips tightly around it. Breathe in slowly and as deeply as possible, raising the piston or the ball toward the top of the column. Hold your breath for 3-5 seconds or for as long as possible. Allow the piston or ball to fall to the bottom of the column. Remove the mouthpiece from your mouth and breathe out normally. Rest for a few seconds and repeat Steps 1 through 7 at least 10 times every 1-2 hours when you are awake. Take your time and take a few normal breaths between deep breaths. The spirometer may include an indicator to show your best effort. Use the indicator as a goal to work toward during each repetition. After each set of 10 deep breaths, practice coughing to be sure your lungs are clear. If you have an incision (the cut made at the time of surgery), support your incision  when coughing by placing a pillow or rolled up towels firmly against it. Once you are able to get out of bed, walk around indoors and cough well. You may stop  using the incentive spirometer when instructed by your caregiver.  RISKS AND COMPLICATIONS Take your time so you do not get dizzy or light-headed. If you are in pain, you may need to take or ask for pain medication before doing incentive spirometry. It is harder to take a deep breath if you are having pain. AFTER USE Rest and breathe slowly and easily. It can be helpful to keep track of a log of your progress. Your caregiver can provide you with a simple table to help with this. If you are using the spirometer at home, follow these instructions: [place] IF:  You are having difficultly using the spirometer. You have trouble using the spirometer as often as instructed. Your pain medication is not giving enough relief while using the spirometer. You develop fever of 100.5 F (38.1 C) or higher. SEEK IMMEDIATE MEDICAL CARE IF:  You cough up bloody sputum that had not been present before. You develop fever of 102 F (38.9 C) or greater. You develop worsening pain at or near the incision site. MAKE SURE YOU:  Understand these instructions. Will watch your condition. Will get help right away if you are not doing well or get worse. Document Released: 07/21/2006 Document Revised: 06/02/2011 Document Reviewed: 09/21/2006 [facility] Patient Information 2014 [place], [place].   ________________________________________________________________________

## 2020-11-22 ENCOUNTER — Inpatient Hospital Stay (HOSPITAL_COMMUNITY)
Admission: RE | Admit: 2020-11-22 | Discharge: 2020-11-22 | Disposition: A | Discharge status: Home / Self Care | Payer: Medicare HMO | Source: Ambulatory Visit

## 2020-11-27 ENCOUNTER — Ambulatory Visit: Admit: 2020-11-27 | Payer: Medicare HMO | Admitting: Orthopedic Surgery

## 2020-11-27 DIAGNOSIS — M1712 Unilateral primary osteoarthritis, left knee: Secondary | ICD-10-CM | POA: Diagnosis present

## 2020-11-27 SURGERY — ARTHROPLASTY, KNEE, TOTAL
Anesthesia: Choice | Site: Knee | Laterality: Left

## 2021-01-05 ENCOUNTER — Ambulatory Visit (HOSPITAL_COMMUNITY)
Admission: EM | Admit: 2021-01-05 | Discharge: 2021-01-05 | Disposition: A | Discharge status: Home / Self Care | Payer: Medicare HMO | Attending: Internal Medicine | Admitting: Internal Medicine

## 2021-01-05 ENCOUNTER — Other Ambulatory Visit: Payer: Self-pay

## 2021-01-05 ENCOUNTER — Encounter (HOSPITAL_COMMUNITY): Payer: Self-pay | Admitting: Emergency Medicine

## 2021-01-05 DIAGNOSIS — J301 Allergic rhinitis due to pollen: Secondary | ICD-10-CM

## 2021-01-05 MED ORDER — FEXOFENADINE HCL 60 MG PO TABS: 60.0000 mg | ORAL_TABLET | Freq: Two times a day (BID) | ORAL | Status: DC

## 2021-01-05 MED ORDER — IPRATROPIUM BROMIDE 0.03 % NA SOLN: 2.0000 | Freq: Two times a day (BID) | NASAL | 12 refills | Status: DC

## 2021-01-05 MED ORDER — GUAIFENESIN ER 600 MG PO TB12: 600.0000 mg | ORAL_TABLET | Freq: Two times a day (BID) | ORAL | 0 refills | Status: AC

## 2021-01-05 MED ORDER — FLUTICASONE PROPIONATE 50 MCG/ACT NA SUSP: 2.0000 | Freq: Every day | NASAL | 0 refills | Status: DC

## 2021-01-05 NOTE — Discharge Instructions (Addendum)
Use medications as directed If your symptoms persist after 1 week you may benefit from further evaluation and possibly antibiotics. Return to urgent care if symptoms persist after 1 week.

## 2021-01-05 NOTE — ED Provider Notes (Signed)
[place]    CSN: [id number] Arrival date & time: 01/05/21  1402      History   Chief Complaint Chief Complaint  Patient presents with   Facial Pain   Nasal Congestion    HPI Daniel Marquez is a 75 y.o. male comes to the urgent care with a 3-day history right-sided facial pain with greenish nasal discharge.  Patient has a history of allergies and has been using saline nasal wash as a way of controlling allergies.  He claims he is mainly vasomotor allergies.  He describes pain on the right side of the face of moderate severity.  He had a fever of 102 Fahrenheit.  No nausea or vomiting.  No blurred or double vision.  No sore throat.  No shortness of breath or wheezing.   HPI  Past Medical History:  Diagnosis Date   Hypertension    Thyroid disease     Patient Active Problem List   Diagnosis Date Noted   Osteoarthritis of left knee 11/20/2020    Past Surgical History:  Procedure Laterality Date   CYST EXCISION     JOINT REPLACEMENT     SHOULDER ARTHROSCOPY     TENDON REPAIR     TONSILLECTOMY         Home Medications    Prior to Admission medications   Medication Sig Start Date End Date Taking? Authorizing Provider  fexofenadine (ALLEGRA) 60 MG tablet Take 1 tablet (60 mg total) by mouth 2 (two) times daily. 01/05/21  Yes Lonny Eisen, Myrene Galas, MD  fluticasone (FLONASE) 50 MCG/ACT nasal spray Place 2 sprays into both nostrils daily. 01/05/21  Yes Ianmichael Amescua, Myrene Galas, MD  guaiFENesin (MUCINEX) 600 MG 12 hr tablet Take 1 tablet (600 mg total) by mouth 2 (two) times daily for 15 days. 01/05/21 01/20/21 Yes Edythe Riches, Myrene Galas, MD  ipratropium (ATROVENT) 0.03 % nasal spray Place 2 sprays into both nostrils every 12 (twelve) hours. 01/05/21  Yes Ysabel Stankovich, Myrene Galas, MD  buprenorphine (SUBUTEX) 8 MG SUBL SL tablet Place under the tongue daily.    [provider]  doxycycline (VIBRA-TABS) 100 MG tablet Take 1 tablet (100 mg total) by mouth 2 (two) times daily.  11/08/17   Raylene Everts, MD  hydrALAZINE (APRESOLINE) 10 MG tablet Take 10 mg by mouth 3 (three) times daily.    [provider]  levothyroxine (SYNTHROID, LEVOTHROID) 25 MCG tablet Take 25 mcg by mouth daily before breakfast.    [provider]  lisinopril (PRINIVIL,ZESTRIL) 10 MG tablet Take 10 mg by mouth daily.    [provider]    Family History Family History  Problem Relation Age of Onset   Cancer Father    Diabetes Father    Hypertension Father    Heart attack Father     Social History Social History   Tobacco Use   Smoking status: Former   Smokeless tobacco: Never  Substance Use Topics   Alcohol use: No   Drug use: No     Allergies   Codeine   Review of Systems Review of Systems  HENT:  Positive for congestion, facial swelling and rhinorrhea. Negative for postnasal drip, sore throat and voice change.   Respiratory:  Negative for cough and shortness of breath.     Physical Exam Triage Vital Signs ED Triage Vitals  Enc Vitals Group     BP 01/05/21 1522 (!) 147/83     Pulse Rate 01/05/21 1522 65  Resp 01/05/21 1522 18     Temp 01/05/21 1522 97.8 F (36.6 C)     Temp Source 01/05/21 1522 Oral     SpO2 01/05/21 1522 95 %     Weight --      Height --      Head Circumference --      Peak Flow --      Pain Score 01/05/21 1521 0     Pain Loc --      Pain Edu? --      Excl. in [place]? --    No data found.  Updated Vital Signs BP (!) 147/83 (BP Location: Right Arm)   Pulse 65   Temp 97.8 F (36.6 C) (Oral)   Resp 18   SpO2 95%   Visual Acuity Right Eye Distance:   Left Eye Distance:   Bilateral Distance:    Right Eye Near:   Left Eye Near:    Bilateral Near:     Physical Exam Vitals and nursing note reviewed.  Constitutional:      General: He is not in acute distress.    Appearance: He is not ill-appearing.  HENT:     Right Ear: Tympanic membrane normal.     Left Ear: Tympanic membrane normal.     Nose:      Comments: Tenderness over the maxillary sinus.  No erythema.  No dental cavities. Cardiovascular:     Rate and Rhythm: Normal rate and regular rhythm.     Pulses: Normal pulses.     Heart sounds: Normal heart sounds.  Neurological:     Mental Status: He is alert.     UC Treatments / Results  Labs (all labs ordered are listed, but only abnormal results are displayed) Labs Reviewed - No data to display  EKG   Radiology No results found.  Procedures Procedures (including critical care time)  Medications Ordered in UC Medications - No data to display  Initial Impression / Assessment and Plan / UC Course  I have reviewed the triage vital signs and the nursing notes.  Pertinent labs & imaging results that were available during my care of the patient were reviewed by me and considered in my medical decision making (see chart for details).     1.  Nonseasonal allergic rhinitis: Fluticasone nasal spray Ipratropium nasal spray Mucinex Consistent use of Allegra use is recommended Return to urgent care if symptoms worsen. Final Clinical Impressions(s) / UC Diagnoses   Final diagnoses:  Non-seasonal allergic rhinitis due to pollen     Discharge Instructions      Use medications as directed If your symptoms persist after 1 week you may benefit from further evaluation and possibly antibiotics. Return to urgent care if symptoms persist after 1 week.   ED Prescriptions     Medication Sig Dispense Auth. Provider   fluticasone (FLONASE) 50 MCG/ACT nasal spray Place 2 sprays into both nostrils daily. 18 g Chase Picket, MD   ipratropium (ATROVENT) 0.03 % nasal spray Place 2 sprays into both nostrils every 12 (twelve) hours. 30 mL Jorene Kaylor, Myrene Galas, MD   fexofenadine (ALLEGRA) 60 MG tablet Take 1 tablet (60 mg total) by mouth 2 (two) times daily. -- Chase Picket, MD   guaiFENesin (MUCINEX) 600 MG 12 hr tablet Take 1 tablet (600 mg total) by mouth 2 (two) times  daily for 15 days. 30 tablet Shalene Gallen, Myrene Galas, MD      PDMP not reviewed this encounter.  Chase Picket, MD 01/05/21 [phone]

## 2021-01-05 NOTE — ED Triage Notes (Signed)
Pt has sinus pain/pressure on right side of face for a couple days with congestion. Went to dentist yesterday for dental pain but nothing wrong at to why believe sinus infection.

## 2021-04-23 ENCOUNTER — Ambulatory Visit: Payer: Medicare HMO | Admitting: Internal Medicine

## 2021-04-25 LAB — BASIC METABOLIC PANEL
BUN: 26 — AB (ref 4–21)
CO2: 28 — AB (ref 13–22)
Chloride: 108 (ref 99–108)
Creatinine: 0.9 (ref 0.6–1.3)
Glucose: 83
Potassium: 4.3 (ref 3.4–5.3)
Sodium: 140 (ref 137–147)

## 2021-04-25 LAB — LIPID PANEL
Cholesterol: 147 (ref 0–200)
HDL: 58 (ref 35–70)
Triglycerides: 86 (ref 40–160)

## 2021-04-25 LAB — HEPATIC FUNCTION PANEL
ALT: 37 (ref 10–40)
AST: 22 (ref 14–40)
Alkaline Phosphatase: 54 (ref 25–125)
Bilirubin, Direct: 0.6 — AB (ref 0.01–0.4)

## 2021-04-25 LAB — COMPREHENSIVE METABOLIC PANEL: Calcium: 9.2 (ref 8.7–10.7)

## 2021-04-26 ENCOUNTER — Encounter: Payer: Self-pay | Admitting: Internal Medicine

## 2021-04-26 ENCOUNTER — Other Ambulatory Visit: Payer: Self-pay

## 2021-04-26 ENCOUNTER — Ambulatory Visit (INDEPENDENT_AMBULATORY_CARE_PROVIDER_SITE_OTHER): Payer: Medicare Other | Admitting: Internal Medicine

## 2021-04-26 VITALS — BP 134/86 | HR 64 | Temp 98.6°F | Resp 16 | Ht 71.0 in | Wt 197.0 lb

## 2021-04-26 DIAGNOSIS — D751 Secondary polycythemia: Secondary | ICD-10-CM | POA: Diagnosis not present

## 2021-04-26 DIAGNOSIS — I10 Essential (primary) hypertension: Secondary | ICD-10-CM | POA: Diagnosis not present

## 2021-04-26 DIAGNOSIS — R718 Other abnormality of red blood cells: Secondary | ICD-10-CM | POA: Insufficient documentation

## 2021-04-26 NOTE — Progress Notes (Signed)
Subjective:  Patient ID: Daniel Marquez, male    DOB: [birth year]  Age: 76 y.o. MRN: [id number]  CC: Abnormal Lab  This visit occurred during the SARS-CoV-2 public health emergency.  Safety protocols were in place, including screening questions prior to the visit, additional usage of staff PPE, and extensive cleaning of exam room while observing appropriate contact time as indicated for disinfecting solutions.    HPI Daniel Marquez presents for establishing.  He has a several year history of erythrocytosis in the setting of using testosterone replacement therapy.  He stopped using the TRT about a week ago.  He was recently seen elsewhere and was told that his erythropoietin level was elevated.  I have some of his recent labs but not a complete set.  I do not have his recent CBC or erythropoietin level.  He had a normal cardiovascular work-up at the [place] about 4 months ago.  He has had some weight loss but denies abdominal pain, nausea, vomiting, chest pain, shortness of breath, or diarrhea.  History Tevyn has a past medical history of Hypertension and Thyroid disease.   He has a past surgical history that includes Tonsillectomy; Cyst excision; Tendon repair; Shoulder arthroscopy; and Joint replacement.   His family history includes Cancer in his father; Diabetes in his father; Heart attack in his father; Hypertension in his father.He reports that he has quit smoking. He has never used smokeless tobacco. He reports that he does not drink alcohol and does not use drugs.  Outpatient Medications Prior to Visit  Medication Sig Dispense Refill   buprenorphine (SUBUTEX) 8 MG SUBL SL tablet Place under the tongue daily.     doxycycline (VIBRA-TABS) 100 MG tablet Take 1 tablet (100 mg total) by mouth 2 (two) times daily. 20 tablet 0   fexofenadine (ALLEGRA) 60 MG tablet Take 1 tablet (60 mg total) by mouth 2 (two) times daily.     fluticasone (FLONASE) 50 MCG/ACT nasal spray Place 2 sprays into  both nostrils daily. 18 g 0   hydrALAZINE (APRESOLINE) 10 MG tablet Take 10 mg by mouth 3 (three) times daily.     ipratropium (ATROVENT) 0.03 % nasal spray Place 2 sprays into both nostrils every 12 (twelve) hours. 30 mL 12   levothyroxine (SYNTHROID, LEVOTHROID) 25 MCG tablet Take 25 mcg by mouth daily before breakfast.     lisinopril (PRINIVIL,ZESTRIL) 10 MG tablet Take 10 mg by mouth daily.     No facility-administered medications prior to visit.    ROS Review of Systems  Constitutional:  Positive for unexpected weight change. Negative for diaphoresis and fatigue.  HENT: Negative.    Eyes: Negative.   Respiratory:  Negative for cough, chest tightness, shortness of breath and wheezing.   Cardiovascular:  Negative for chest pain, palpitations and leg swelling.  Gastrointestinal:  Negative for abdominal pain, constipation, diarrhea, nausea and vomiting.  Endocrine: Negative.   Genitourinary: Negative.  Negative for difficulty urinating.  Musculoskeletal:  Positive for arthralgias. Negative for myalgias.  Skin: Negative.   Neurological: Negative.  Negative for dizziness, weakness and light-headedness.  Hematological:  Negative for adenopathy. Does not bruise/bleed easily.  Psychiatric/Behavioral: Negative.     Objective:  BP 134/86 (BP Location: Left Arm, Patient Position: Sitting, Cuff Size: Large)    Pulse 64    Temp 98.6 F (37 C) (Oral)    Resp 16    Ht 5\' 11"  (1.803 m)    Wt 197 lb (89.4 kg)    SpO2 98%  BMI 27.48 kg/m   Physical Exam Vitals reviewed.  Constitutional:      Appearance: He is not ill-appearing.  HENT:     Nose: Nose normal.     Mouth/Throat:     Mouth: Mucous membranes are moist.  Eyes:     General: No scleral icterus.    Conjunctiva/sclera: Conjunctivae normal.  Cardiovascular:     Rate and Rhythm: Normal rate and regular rhythm.     Heart sounds: No murmur heard. Pulmonary:     Effort: Pulmonary effort is normal.     Breath sounds: No stridor. No  wheezing, rhonchi or rales.  Abdominal:     General: Abdomen is flat.     Palpations: There is no mass.     Tenderness: There is no abdominal tenderness. There is no guarding.     Hernia: No hernia is present.  Musculoskeletal:     Cervical back: Neck supple.     Right lower leg: No edema.     Left lower leg: No edema.  Lymphadenopathy:     Cervical: No cervical adenopathy.  Skin:    General: Skin is warm and dry.  Neurological:     General: No focal deficit present.     Mental Status: He is alert.  Psychiatric:        Mood and Affect: Mood normal.        Behavior: Behavior normal.    Lab Results  Component Value Date   CHOL 147 04/25/2021   TRIG 86 04/25/2021   HDL 58 04/25/2021   ALT 37 04/25/2021   AST 22 04/25/2021   NA 140 04/25/2021   K 4.3 04/25/2021   CL 108 04/25/2021   CREATININE 0.9 04/25/2021   BUN 26 (A) 04/25/2021   CO2 28 (A) 04/25/2021     Assessment & Plan:   Daniel Marquez was seen today for abnormal lab.  Diagnoses and all orders for this visit:  High serum erythropoietin- He tells me that records are being sent from his recent PCP.  At this time I have no access to his recent erythropoietin level.  I recommended that we screen the abdomen for an erythropoietin producing tumor. -     CT Abdomen Pelvis W Contrast; Future -     Ambulatory referral to Hematology / Oncology  Erythrocytosis- See above. -     CT Abdomen Pelvis W Contrast; Future -     Ambulatory referral to Hematology / Oncology  Primary hypertension- His blood pressure is adequately well controlled.   I am having Daniel Marquez maintain his lisinopril, hydrALAZINE, levothyroxine, buprenorphine, doxycycline, fluticasone, ipratropium, and fexofenadine.  No orders of the defined types were placed in this encounter.    Follow-up: Return in about 3 months (around 07/24/2021).  Daniel Calico, MD

## 2021-04-26 NOTE — Patient Instructions (Signed)
Polycythemia Vera Polycythemia vera (PV), or myeloproliferative disease, is a form of blood cancer in which the bone marrow makes too many (overproduces) red blood cells. The bone marrow may also make too many clotting cells (platelets) and white blood cells. Bone marrow is the spongy center of bones where blood cells are produced. Sometimes, there may be an overproduction of blood cells in the liver and spleen, causing those organs to become enlarged. Additionally, people who have PV are at a higher risk for stroke or heart attack because their blood may clot more easily. PV is a long-term (chronic)disease. What are the causes? Almost all people who have PV have an abnormal gene (genetic mutation) that causes changes in the way that the bone marrow makes blood cells. This gene, which is called JAK2, is not passed along from parent to child (is not hereditary). It is not known what triggers the genetic mutation that causes the body to produce too many red blood cells. What increases the risk? You are more likely to develop this condition if you are: Male. 7 years of age or older. What are the signs or symptoms? You may not have any symptoms in the early stage of PV. When symptoms develop, they may include: Shortness of breath. Dizziness. Hot and flushed skin. Itchy skin. Sweats, especially night sweats. Headache. Tiredness. Ringing in the ears. Blurred vision or blind spots. Bone pain. Weight loss. Fever. Blood-tinged vomit or stool. How is this diagnosed? This condition may be diagnosed during a routine physical exam and a blood test called a complete blood count (CBC). Your health care provider also may suspect PV if you have symptoms. During the physical exam, your provider may find that you have an enlarged liver or spleen. You may also have tests to confirm the diagnosis. These may include: A procedure to remove a sample of bone marrow for testing (bone marrow biopsy). Blood tests to  check for: The JAK2 gene. Low levels of a hormone that helps to regulate blood production (erythropoietin). How is this treated? There is no cure for PV, but treatment can help to control the disease. There are several types of treatment. No single treatment works for everyone. You will need to work with a blood cancer specialist (hematologist) to find the treatment that is best for you. This condition may be treated by: Periodically having some blood removed with a needle (drawn) to lower the number of red blood cells (phlebotomy). Taking medicine. Your health care provider may recommend: Low-dose aspirin to lower your risk for blood clots. A medicine to reduce red blood cell production. A medicine to lower the number of red blood cells. A medicine that slows down the effects of JAK2 gene. Other medicines to treat symptoms such as itching. Follow these instructions at home:  Take over-the-counter and prescription medicines only as told by your health care provider. Return to your normal activities as told by your health care provider. Ask your health care provider what activities are safe for you. Do regular exercise as told by your health care provider. Check your hands and feet regularly for any sores that do not heal. Do not use any products that contain nicotine or tobacco, such as cigarettes, e-cigarettes, and chewing tobacco. If you need help quitting, ask your health care provider. Keep all follow-up visits as told by your health care provider. This is important. Contact a health care provider if: You have side effects from your medicines. Your symptoms change or get worse at home.  You have blood in your stool or you vomit blood. Get help right away if: You have sudden and severe pain in your abdomen. You have chest pain or difficulty breathing. You have signs of stroke, such as: Sudden numbness. Weakness of your face or arm. Confusion. Difficulty speaking or understanding  speech. These symptoms may represent a serious problem that is an emergency. Do not wait to see if the symptoms will go away. Get medical help right away. Call your local emergency services (911 in the [place] Do not drive yourself to the hospital. Summary Polycythemia vera is a form of blood cancer in which the bone marrow makes too many red blood cells. People who have polycythemia vera are at a higher risk for stroke or heart attack because their blood may clot more easily. The disease is caused by a genetic mutation that causes changes in the way that the bone marrow makes blood cells. It is diagnosed with blood tests and a bone marrow biopsy. There is no cure for PV, but treatment can help to control the disease. It is treated by periodically having some blood removed and by taking medicines. This information is not intended to replace advice given to you by your health care provider. Make sure you discuss any questions you have with your health care provider. Document Revised: 01/14/2018 Document Reviewed: 01/14/2018 Elsevier Patient Education  2022 [employer].

## 2021-04-29 ENCOUNTER — Other Ambulatory Visit: Payer: Self-pay | Admitting: *Deleted

## 2021-04-29 ENCOUNTER — Encounter: Payer: Self-pay | Admitting: Internal Medicine

## 2021-04-29 DIAGNOSIS — D751 Secondary polycythemia: Secondary | ICD-10-CM

## 2021-04-29 NOTE — Progress Notes (Signed)
Lab orders placed for new patient visit in March.

## 2021-04-30 ENCOUNTER — Encounter: Payer: Self-pay | Admitting: Internal Medicine

## 2021-05-01 ENCOUNTER — Telehealth: Payer: Self-pay | Admitting: Nurse Practitioner

## 2021-05-01 ENCOUNTER — Encounter: Payer: Self-pay | Admitting: Internal Medicine

## 2021-05-01 NOTE — Telephone Encounter (Signed)
Contacted patient to schedule appts from referral. Patient is aware of appt date and time.

## 2021-05-07 ENCOUNTER — Other Ambulatory Visit: Payer: Medicare Other

## 2021-05-07 ENCOUNTER — Ambulatory Visit
Admission: RE | Admit: 2021-05-07 | Discharge: 2021-05-07 | Disposition: A | Discharge status: Home / Self Care | Payer: Medicare Other | Source: Ambulatory Visit | Attending: Internal Medicine | Admitting: Internal Medicine

## 2021-05-07 DIAGNOSIS — K8020 Calculus of gallbladder without cholecystitis without obstruction: Secondary | ICD-10-CM | POA: Diagnosis not present

## 2021-05-07 DIAGNOSIS — K7689 Other specified diseases of liver: Secondary | ICD-10-CM | POA: Diagnosis not present

## 2021-05-07 DIAGNOSIS — D751 Secondary polycythemia: Secondary | ICD-10-CM

## 2021-05-07 DIAGNOSIS — R634 Abnormal weight loss: Secondary | ICD-10-CM | POA: Diagnosis not present

## 2021-05-07 DIAGNOSIS — K5730 Diverticulosis of large intestine without perforation or abscess without bleeding: Secondary | ICD-10-CM | POA: Diagnosis not present

## 2021-05-07 DIAGNOSIS — R718 Other abnormality of red blood cells: Secondary | ICD-10-CM

## 2021-05-07 MED ORDER — IOPAMIDOL (ISOVUE-300) INJECTION 61%
100.0000 mL | Freq: Once | INTRAVENOUS | Status: AC | PRN
  Administered 2021-05-07: 100 mL via INTRAVENOUS

## 2021-05-10 ENCOUNTER — Encounter: Payer: Self-pay | Admitting: Internal Medicine

## 2021-05-16 ENCOUNTER — Encounter: Payer: Self-pay | Admitting: Internal Medicine

## 2021-05-22 ENCOUNTER — Other Ambulatory Visit: Payer: Self-pay

## 2021-05-22 ENCOUNTER — Inpatient Hospital Stay: Payer: Medicare Other | Attending: Oncology

## 2021-05-22 DIAGNOSIS — I10 Essential (primary) hypertension: Secondary | ICD-10-CM | POA: Insufficient documentation

## 2021-05-22 DIAGNOSIS — E039 Hypothyroidism, unspecified: Secondary | ICD-10-CM | POA: Diagnosis not present

## 2021-05-22 DIAGNOSIS — D751 Secondary polycythemia: Secondary | ICD-10-CM | POA: Diagnosis not present

## 2021-05-22 LAB — CBC WITH DIFFERENTIAL (CANCER CENTER ONLY)
Abs Immature Granulocytes: 0.01 10*3/uL (ref 0.00–0.07)
Basophils Absolute: 0 10*3/uL (ref 0.0–0.1)
Basophils Relative: 1 %
Eosinophils Absolute: 0.2 10*3/uL (ref 0.0–0.5)
Eosinophils Relative: 3 %
HCT: 50.9 % (ref 39.0–52.0)
Hemoglobin: 16.7 g/dL (ref 13.0–17.0)
Immature Granulocytes: 0 %
Lymphocytes Relative: 31 %
Lymphs Abs: 2 10*3/uL (ref 0.7–4.0)
MCH: 30.1 pg (ref 26.0–34.0)
MCHC: 32.8 g/dL (ref 30.0–36.0)
MCV: 91.9 fL (ref 80.0–100.0)
Monocytes Absolute: 0.7 10*3/uL (ref 0.1–1.0)
Monocytes Relative: 10 %
Neutro Abs: 3.5 10*3/uL (ref 1.7–7.7)
Neutrophils Relative %: 55 %
Platelet Count: 210 10*3/uL (ref 150–400)
RBC: 5.54 MIL/uL (ref 4.22–5.81)
RDW: 13.2 % (ref 11.5–15.5)
WBC Count: 6.4 10*3/uL (ref 4.0–10.5)
nRBC: 0 % (ref 0.0–0.2)

## 2021-05-22 LAB — SAVE SMEAR(SSMR), FOR PROVIDER SLIDE REVIEW

## 2021-05-24 ENCOUNTER — Other Ambulatory Visit: Payer: Self-pay | Admitting: Nurse Practitioner

## 2021-05-24 ENCOUNTER — Encounter: Payer: Self-pay | Admitting: Nurse Practitioner

## 2021-05-24 ENCOUNTER — Inpatient Hospital Stay: Payer: Medicare Other | Admitting: Nurse Practitioner

## 2021-05-24 ENCOUNTER — Other Ambulatory Visit (HOSPITAL_BASED_OUTPATIENT_CLINIC_OR_DEPARTMENT_OTHER): Payer: Self-pay

## 2021-05-24 ENCOUNTER — Other Ambulatory Visit: Payer: Self-pay

## 2021-05-24 VITALS — BP 142/77 | HR 67 | Temp 97.8°F | Resp 18 | Ht 71.0 in | Wt 195.2 lb

## 2021-05-24 DIAGNOSIS — E039 Hypothyroidism, unspecified: Secondary | ICD-10-CM

## 2021-05-24 DIAGNOSIS — K769 Liver disease, unspecified: Secondary | ICD-10-CM

## 2021-05-24 DIAGNOSIS — D751 Secondary polycythemia: Secondary | ICD-10-CM

## 2021-05-24 DIAGNOSIS — I10 Essential (primary) hypertension: Secondary | ICD-10-CM

## 2021-05-24 DIAGNOSIS — Z87891 Personal history of nicotine dependence: Secondary | ICD-10-CM | POA: Diagnosis not present

## 2021-05-24 DIAGNOSIS — F1091 Alcohol use, unspecified, in remission: Secondary | ICD-10-CM | POA: Diagnosis not present

## 2021-05-24 DIAGNOSIS — Z79890 Hormone replacement therapy (postmenopausal): Secondary | ICD-10-CM

## 2021-05-24 NOTE — Progress Notes (Signed)
New Hematology/Oncology Consult   Requesting MD: Dr. Scarlette Calico  [phone]  Reason for Consult: Erythrocytosis, high serum erythropoietin  HPI: Daniel Marquez is a 76 year old man referred for evaluation of erythrocytosis and high serum erythropoietin in the setting of testosterone replacement therapy.  Testosterone replacement therapy was discontinued about a month ago.  Labs from 04/15/2021 showed free testosterone 232, hemoglobin 18.1, hematocrit 54, MCV 90, white count and platelets in normal range, unremarkable chemistry panel, erythropoietin mildly elevated at 23 (2.6-18.5).  Comparison labs from 04/09/2021-creatinine 1.51, hemoglobin 18.8, hematocrit 55.  He had CT scans abdomen/pelvis 05/07/2021 due to unintended weight loss, screen for an erythropoietin producing tumor.  He was noted to have numerous low-density lesions throughout the liver, felt to represent simple cysts; bilateral renal low-density lesions also most compatible with cysts; cholelithiasis, sigmoid diverticulosis, aortic atherosclerosis.   Past Medical History:  Diagnosis Date   Hypertension    Thyroid disease      Past Surgical History:  Procedure Laterality Date   CYST EXCISION     JOINT REPLACEMENT     SHOULDER ARTHROSCOPY     TENDON REPAIR     TONSILLECTOMY    :   Current Outpatient Medications:    atenolol (TENORMIN) 100 MG tablet, Take 100 mg by mouth daily., Disp: , Rfl:    famotidine (PEPCID) 40 MG tablet, Take 40 mg by mouth daily., Disp: , Rfl:    finasteride (PROSCAR) 5 MG tablet, Take 5 mg by mouth daily., Disp: , Rfl:    ipratropium (ATROVENT) 0.03 % nasal spray, Place 2 sprays into both nostrils every 12 (twelve) hours., Disp: 30 mL, Rfl: 12   levothyroxine (SYNTHROID, LEVOTHROID) 25 MCG tablet, Take 25 mcg by mouth daily before breakfast., Disp: , Rfl:    losartan-hydrochlorothiazide (HYZAAR) 100-25 MG tablet, Take 1 tablet by mouth daily., Disp: , Rfl:    Multiple Vitamin (MULTI-VITAMIN  DAILY PO), Take by mouth., Disp: , Rfl:    Omega-3 Fatty Acids (FISH OIL) 1000 MG CAPS, Take 1,000 capsules by mouth., Disp: , Rfl:    Red Yeast Rice 600 MG CAPS, Take 600 mg by mouth., Disp: , Rfl:    Resveratrol 100 MG CAPS, Take by mouth., Disp: , Rfl:    tadalafil (CIALIS) 5 MG tablet, Take 5 mg by mouth daily as needed for erectile dysfunction., Disp: , Rfl:    traMADol (ULTRAM) 50 MG tablet, SMARTSIG:0.5-1 Tablet(s) By Mouth PRN, Disp: , Rfl: :    Allergies  Allergen Reactions   Codeine Other (See Comments)    Syncope    FH: No family history of a blood disorder.  Father deceased with "cancer", type unknown.  SOCIAL HISTORY: He lives in [place].  He is engaged.  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  He quit smoking in 1983, estimates 9 total years of tobacco use.  He quit drinking alcohol in 1976.  Review of Systems: No fever.  He has intermittent sweating episodes.  He has noticed flushing on his chest and face.  This improved about 3 weeks ago.  No anorexia.  He estimates 17 pounds of weight loss over the past 6 months, somewhat intentional.  He is exercising.  Lately he is having periodic headaches.  No diplopia.  No cough.  He has exertional dyspnea.  For the past month he has had intermittent constipation/diarrhea and "morning sickness".  No bloody or black bowel movements.  He thinks he is up-to-date on colonoscopy.  He has BPH.  He reports  neuropathy of unknown cause.  Symptoms have improved.  He reports having no cardiac issues.  No known lung problems.  He has never had a heart attack or stroke.  No itching after showers.  Physical Exam:  Blood pressure (!) 142/77, pulse 67, temperature 97.8 F (36.6 C), temperature source Oral, resp. rate 18, height 5\' 11"  (1.803 m), weight 195 lb 3.2 oz (88.5 kg), SpO2 100 %.   Lungs: Lungs clear bilaterally. Cardiac: Regular rate and rhythm. Abdomen: Abdomen soft and nontender.  No hepatosplenomegaly. Vascular: No leg  edema. Lymph nodes: No palpable cervical, supraclavicular, axillary or inguinal lymph nodes. Neurologic: Alert and oriented.  Follows commands. Skin: No rash.  LABS:   Recent Labs    05/22/21 1340  WBC 6.4  HGB 16.7  HCT 50.9  PLT 210  Peripheral blood smear-few ovalocytes, red blood cells otherwise unremarkable, polychromasia not increased; white blood cell morphology unremarkable; platelets normal in number  No results for input(s): NA, K, CL, CO2, GLUCOSE, BUN, CREATININE, CALCIUM in the last 72 hours.    RADIOLOGY:  CT Abdomen Pelvis W Contrast  Result Date: 05/07/2021 CLINICAL DATA:  Unintended weight loss EXAM: CT ABDOMEN AND PELVIS WITH CONTRAST TECHNIQUE: Multidetector CT imaging of the abdomen and pelvis was performed using the standard protocol following bolus administration of intravenous contrast. RADIATION DOSE REDUCTION: This exam was performed according to the departmental dose-optimization program which includes automated exposure control, adjustment of the mA and/or kV according to patient size and/or use of iterative reconstruction technique. CONTRAST:  111mL ISOVUE-300 IOPAMIDOL (ISOVUE-300) INJECTION 61% COMPARISON:  10/19/2015 FINDINGS: Lower chest: No acute abnormality.  Small hiatal hernia. Hepatobiliary: Numerous low-density lesions throughout the liver, the largest in the right hepatic lobe measuring 8.2 cm compared with 7 cm previously. These appear to reflect simple cysts. Small layering stones in the gallbladder. Pancreas: No focal abnormality or ductal dilatation. Spleen: No focal abnormality.  Normal size. Adrenals/Urinary Tract: 1.8 cm low-density lesion in the midpole of the left kidney appears to reflect a simple cyst. 1.8 cm low-density lesion in the midpole of the right kidney also appears to reflect a simple cyst. No hydronephrosis. Adrenal glands and urinary bladder unremarkable. Stomach/Bowel: Sigmoid diverticulosis. No active diverticulitis. Stomach and  small bowel decompressed, unremarkable. Duodenal diverticulum in the 2/3 portion of the duodenum measures 3.7 cm. Vascular/Lymphatic: Aortic atherosclerosis. No evidence of aneurysm or adenopathy. Reproductive: No visible focal abnormality.  Penile implant noted. Other: No free fluid or free air. Musculoskeletal: No acute bony abnormality. Degenerative changes in the lumbar spine. Bilateral L5 pars defects noted. Subcutaneous soft tissue nodule along the skin surface in the left lateral abdominal wall measures 2.7 cm compared to 1.7 cm on prior study. This most likely reflects sebaceous cyst. IMPRESSION: Numerous low-density lesions throughout the liver measuring up to 8.2 cm. These appear to reflect simple cysts. Bilateral renal low-density lesions most compatible with cysts. Cholelithiasis. Sigmoid diverticulosis.  No active diverticulitis. Aortic atherosclerosis. No acute findings. Electronically Signed   By: Rolm Baptise M.D.   On: 05/07/2021 23:22    Assessment and Plan:   Erythrocytosis, mild elevation of erythropoietin level Prior testosterone replacement therapy Hypothyroid Hypertension  Daniel Marquez has been referred for evaluation of erythrocytosis and mild elevation of the erythropoietin level.  He was previously on testosterone replacement therapy.  This was discontinued about a month ago.  The CBC done in our office 05/22/2021 returned with the hemoglobin/hematocrit in normal range.  An erythropoietin level is pending.  We discussed  the likelihood that the erythrocytosis and elevation of the erythropoietin level are related to testosterone replacement therapy.  We did not schedule additional follow-up in our office.  We are available to see him in the future if he develops progressive hematologic abnormality.  Patient seen with Dr. Luberta Robertson, NP 05/24/2021, 12:00 PM   This was a shared visit with Ned Card.  Daniel Marquez was interviewed and examined.  I reviewed the peripheral  blood smear.  He was referred for evaluation of mild erythrocytosis and mild elevation of the erythropoietin level.  I suspect the mild erythrocytosis, and potentially the mild elevation of the erythropoietin level, were related to testosterone therapy.  We discussed the increased risk of cardiovascular and thromboembolic disease with an elevated hemoglobin due to testosterone therapy.  The hemoglobin is now in the normal range while off of testosterone therapy.  We have a low clinical suspicion for a malignancy or myeloproliferative disorder.  HCTZ therapy could be contributing to a "pseudo erythrocytosis ".  I recommend Dr. Ronnald Ramp continue to monitor the hemoglobin/hematocrit.  We will be glad to see him if he develops recurrent erythrocytosis.  He will discuss the indication for resuming testosterone therapy with Dr. Ronnald Ramp.  I was present for greater than 50% of today's visit.  I performed medical decision making.  Julieanne Manson, MD

## 2021-05-25 LAB — ERYTHROPOIETIN: Erythropoietin: 12.5 m[IU]/mL (ref 2.6–18.5)

## 2021-05-26 ENCOUNTER — Encounter: Payer: Self-pay | Admitting: Nurse Practitioner

## 2021-06-04 ENCOUNTER — Encounter: Payer: Self-pay | Admitting: Internal Medicine

## 2021-06-04 ENCOUNTER — Ambulatory Visit (INDEPENDENT_AMBULATORY_CARE_PROVIDER_SITE_OTHER): Payer: Medicare Other | Admitting: Internal Medicine

## 2021-06-04 ENCOUNTER — Other Ambulatory Visit: Payer: Self-pay

## 2021-06-04 VITALS — BP 142/78 | HR 67 | Temp 98.3°F | Resp 16 | Ht 71.0 in | Wt 199.0 lb

## 2021-06-04 DIAGNOSIS — K582 Mixed irritable bowel syndrome: Secondary | ICD-10-CM

## 2021-06-04 DIAGNOSIS — I10 Essential (primary) hypertension: Secondary | ICD-10-CM

## 2021-06-04 MED ORDER — HYOSCYAMINE SULFATE ER 0.375 MG PO TB12: 0.3750 mg | ORAL_TABLET | Freq: Two times a day (BID) | ORAL | 0 refills | Status: DC

## 2021-06-04 NOTE — Progress Notes (Signed)
? ?Subjective:  ?Patient ID: Daniel Marquez, male    DOB: 09-29-1945  Age: 76 y.o. MRN: [id number] ? ?CC: Abdominal Pain ? ?This visit occurred during the SARS-CoV-2 public health emergency.  Safety protocols were in place, including screening questions prior to the visit, additional usage of staff PPE, and extensive cleaning of exam room while observing appropriate contact time as indicated for disinfecting solutions.   ? ?HPI ?Daniel Marquez presents for f/up - ? ?He complains of a several month history of intermittent abdominal cramping.  He has gotten modest symptom relief with Pepcid and milk of magnesia.  He has also alternating constipation and diarrhea.  A recent CT scan was unremarkable.  His appetite is good and he complains of weight gain. ? ?Outpatient Medications Prior to Visit  ?Medication Sig Dispense Refill  ? atenolol (TENORMIN) 100 MG tablet Take 100 mg by mouth daily.    ? famotidine (PEPCID) 40 MG tablet Take 40 mg by mouth daily.    ? finasteride (PROSCAR) 5 MG tablet Take 5 mg by mouth daily.    ? ipratropium (ATROVENT) 0.03 % nasal spray Place 2 sprays into both nostrils every 12 (twelve) hours. 30 mL 12  ? levothyroxine (SYNTHROID, LEVOTHROID) 25 MCG tablet Take 25 mcg by mouth daily before breakfast.    ? losartan-hydrochlorothiazide (HYZAAR) 100-25 MG tablet Take 1 tablet by mouth daily.    ? Multiple Vitamin (MULTI-VITAMIN DAILY PO) Take by mouth.    ? Omega-3 Fatty Acids (FISH OIL) 1000 MG CAPS Take 1,000 capsules by mouth.    ? Red Yeast Rice 600 MG CAPS Take 600 mg by mouth.    ? Resveratrol 100 MG CAPS Take by mouth.    ? tadalafil (CIALIS) 5 MG tablet Take 5 mg by mouth daily as needed for erectile dysfunction.    ? traMADol (ULTRAM) 50 MG tablet SMARTSIG:0.5-1 Tablet(s) By Mouth PRN    ? ?No facility-administered medications prior to visit.  ? ? ?ROS ?Review of Systems  ?Constitutional:  Positive for unexpected weight change (wt gain). Negative for appetite change, chills,  diaphoresis and fatigue.  ?HENT: Negative.  Negative for trouble swallowing.   ?Eyes: Negative.  Negative for visual disturbance.  ?Gastrointestinal:  Positive for abdominal pain, constipation and diarrhea. Negative for blood in stool, nausea and vomiting.  ?Endocrine: Negative.   ?Genitourinary:  Negative for difficulty urinating and hematuria.  ?Musculoskeletal: Negative.  Negative for arthralgias and myalgias.  ?Skin: Negative.   ?Neurological:  Negative for dizziness, weakness and light-headedness.  ?Hematological:  Negative for adenopathy. Does not bruise/bleed easily.  ?Psychiatric/Behavioral: Negative.    ? ?Objective:  ?BP (!) 142/78 (BP Location: Left Arm, Patient Position: Sitting, Cuff Size: Large)   Pulse 67   Temp 98.3 ?F (36.8 ?C) (Oral)   Resp 16   Ht '5\' 11"'$  (1.803 m)   Wt 199 lb (90.3 kg)   SpO2 97%   BMI 27.75 kg/m?  ? ?BP Readings from Last 3 Encounters:  ?06/04/21 (!) 142/78  ?05/24/21 (!) 142/77  ?04/26/21 134/86  ? ? ?Wt Readings from Last 3 Encounters:  ?06/04/21 199 lb (90.3 kg)  ?05/24/21 195 lb 3.2 oz (88.5 kg)  ?04/26/21 197 lb (89.4 kg)  ? ? ?Physical Exam ?Vitals reviewed.  ?Constitutional:   ?   Appearance: He is not ill-appearing.  ?HENT:  ?   Mouth/Throat:  ?   Mouth: Mucous membranes are moist.  ?Eyes:  ?   General: No scleral icterus. ?  Conjunctiva/sclera: Conjunctivae normal.  ?Cardiovascular:  ?   Rate and Rhythm: Normal rate and regular rhythm.  ?   Heart sounds: No murmur heard. ?Pulmonary:  ?   Effort: Pulmonary effort is normal.  ?   Breath sounds: No stridor. No wheezing, rhonchi or rales.  ?Abdominal:  ?   General: Abdomen is protuberant. There is no distension.  ?   Palpations: Abdomen is soft. There is no hepatomegaly, splenomegaly or mass.  ?   Tenderness: There is no abdominal tenderness.  ?   Hernia: No hernia is present.  ?Musculoskeletal:  ?   Cervical back: Neck supple.  ?Lymphadenopathy:  ?   Cervical: No cervical adenopathy.  ? ? ?Lab Results  ?Component  Value Date  ? WBC 6.4 05/22/2021  ? HGB 16.7 05/22/2021  ? HCT 50.9 05/22/2021  ? PLT 210 05/22/2021  ? CHOL 147 04/25/2021  ? TRIG 86 04/25/2021  ? HDL 58 04/25/2021  ? ALT 37 04/25/2021  ? AST 22 04/25/2021  ? NA 140 04/25/2021  ? K 4.3 04/25/2021  ? CL 108 04/25/2021  ? CREATININE 0.9 04/25/2021  ? BUN 26 (A) 04/25/2021  ? CO2 28 (A) 04/25/2021  ? ? ?CT Abdomen Pelvis W Contrast ? ?Result Date: 05/07/2021 ?CLINICAL DATA:  Unintended weight loss EXAM: CT ABDOMEN AND PELVIS WITH CONTRAST TECHNIQUE: Multidetector CT imaging of the abdomen and pelvis was performed using the standard protocol following bolus administration of intravenous contrast. RADIATION DOSE REDUCTION: This exam was performed according to the departmental dose-optimization program which includes automated exposure control, adjustment of the mA and/or kV according to patient size and/or use of iterative reconstruction technique. CONTRAST:  152m ISOVUE-300 IOPAMIDOL (ISOVUE-300) INJECTION 61% COMPARISON:  10/19/2015 FINDINGS: Lower chest: No acute abnormality.  Small hiatal hernia. Hepatobiliary: Numerous low-density lesions throughout the liver, the largest in the right hepatic lobe measuring 8.2 cm compared with 7 cm previously. These appear to reflect simple cysts. Small layering stones in the gallbladder. Pancreas: No focal abnormality or ductal dilatation. Spleen: No focal abnormality.  Normal size. Adrenals/Urinary Tract: 1.8 cm low-density lesion in the midpole of the left kidney appears to reflect a simple cyst. 1.8 cm low-density lesion in the midpole of the right kidney also appears to reflect a simple cyst. No hydronephrosis. Adrenal glands and urinary bladder unremarkable. Stomach/Bowel: Sigmoid diverticulosis. No active diverticulitis. Stomach and small bowel decompressed, unremarkable. Duodenal diverticulum in the 2/3 portion of the duodenum measures 3.7 cm. Vascular/Lymphatic: Aortic atherosclerosis. No evidence of aneurysm or  adenopathy. Reproductive: No visible focal abnormality.  Penile implant noted. Other: No free fluid or free air. Musculoskeletal: No acute bony abnormality. Degenerative changes in the lumbar spine. Bilateral L5 pars defects noted. Subcutaneous soft tissue nodule along the skin surface in the left lateral abdominal wall measures 2.7 cm compared to 1.7 cm on prior study. This most likely reflects sebaceous cyst. IMPRESSION: Numerous low-density lesions throughout the liver measuring up to 8.2 cm. These appear to reflect simple cysts. Bilateral renal low-density lesions most compatible with cysts. Cholelithiasis. Sigmoid diverticulosis.  No active diverticulitis. Aortic atherosclerosis. No acute findings. Electronically Signed   By: KRolm BaptiseM.D.   On: 05/07/2021 23:22  ? ? ?Assessment & Plan:  ? ?COvilawas seen today for abdominal pain. ? ?Diagnoses and all orders for this visit: ? ?Irritable bowel syndrome with both constipation and diarrhea- Will treat with an antispasmodic as needed. ?-     Discontinue: hyoscyamine (LEVBID) 0.375 MG 12 hr tablet; Take 1  tablet (0.375 mg total) by mouth 2 (two) times daily. ? ?Primary hypertension- His blood pressure is adequately well controlled. ? ? ?I am having Daniel Marquez maintain his levothyroxine, ipratropium, traMADol, Fish Oil, Red Yeast Rice, famotidine, atenolol, tadalafil, Multiple Vitamin (MULTI-VITAMIN DAILY PO), losartan-hydrochlorothiazide, finasteride, and Resveratrol. ? ?Meds ordered this encounter  ?Medications  ? DISCONTD: hyoscyamine (LEVBID) 0.375 MG 12 hr tablet  ?  Sig: Take 1 tablet (0.375 mg total) by mouth 2 (two) times daily.  ?  Dispense:  180 tablet  ?  Refill:  0  ? ? ? ?Follow-up: Return in about 6 months (around 12/05/2021). ? ?Scarlette Calico, MD ?

## 2021-06-04 NOTE — Patient Instructions (Signed)
Irritable Bowel Syndrome, Adult ?Irritable bowel syndrome (IBS) is a group of symptoms that affects the organs responsible for digestion (gastrointestinal or GI tract). IBS is not one specific disease. ?To regulate how the GI tract works, the body sends signals back and forth between the intestines and the brain. If you have IBS, there may be a problem with these signals. As a result, the GI tract does not function normally. The intestines may become more sensitive and overreact to certain things. This may be especially true when you eat certain foods or when you are under stress. ?There are four types of IBS. These may be determined based on the consistency of your stool (feces): ?IBS with diarrhea. ?IBS with constipation. ?Mixed IBS. ?Unsubtyped IBS. ?It is important to know which type of IBS you have. Certain treatments are more likely to be helpful for certain types of IBS. ?What are the causes? ?The exact cause of IBS is not known. ?What increases the risk? ?You may have a higher risk for IBS if you: ?Are male. ?Are younger than 20. ?Have a family history of IBS. ?Have a mental health condition, such as depression, anxiety, or post-traumatic stress disorder. ?Have had a bacterial infection of your GI tract. ?What are the signs or symptoms? ?Symptoms of IBS vary from person to person. The main symptom is abdominal pain or discomfort. Other symptoms usually include one or more of the following: ?Diarrhea, constipation, or both. ?Abdominal swelling or bloating. ?Feeling full after eating a small or regular-sized meal. ?Frequent gas. ?Mucus in the stool. ?A feeling of having more stool left after a bowel movement. ?Symptoms tend to come and go. They may be triggered by stress, mental health conditions, or certain foods. ?How is this diagnosed? ?This condition may be diagnosed based on a physical exam, your medical history, and your symptoms. You may have tests, such as: ?Blood tests. ?Stool test. ?X-rays. ?CT  scan. ?Colonoscopy. This is a procedure in which your GI tract is viewed with a long, thin, flexible tube. ?How is this treated? ?There is no cure for IBS, but treatment can help relieve symptoms. Treatment depends on the type of IBS you have, and may include: ?Changes to your diet, such as: ?Avoiding foods that cause symptoms. ?Drinking more water. ?Following a low-FODMAP (fermentable oligosaccharides, disaccharides, monosaccharides, and polyols) diet for up to 6 weeks, or as told by your health care provider. FODMAPs are sugars that are hard for some people to digest. ?Eating more fiber. ?Eating medium-sized meals at the same times every day. ?Medicines. These may include: ?Fiber supplements, if you have constipation. ?Medicine to control diarrhea (antidiarrheal medicines). ?Medicine to help control muscle tightening (spasms) in your GI tract (antispasmodic medicines). ?Medicines to help with mental health conditions, such as antidepressants or tranquilizers. ?Talk therapy or counseling. ?Working with a diet and nutrition specialist (dietitian) to help create a food plan that is right for you. ?Managing your stress. ?Follow these instructions at home: ?Eating and drinking ?Eat a healthy diet. ?Eat medium-sized meals at about the same time every day. Do not eat large meals. ?Gradually eat more fiber-rich foods. These include whole grains, fruits, and vegetables. This may be especially helpful if you have IBS with constipation. ?Eat a diet low in FODMAPs. ?Drink enough fluid to keep your urine pale yellow. ?Keep a journal of foods that seem to trigger symptoms. ?Avoid foods and drinks that: ?Contain added sugar. ?Make your symptoms worse. Dairy products, caffeinated drinks, and carbonated drinks can make symptoms worse  for some people. ?General instructions ?Take over-the-counter and prescription medicines and supplements only as told by your health care provider. ?Get enough exercise. Do at least 150 minutes of  moderate-intensity exercise each week. ?Manage your stress. Getting enough sleep and exercise can help you manage stress. ?Keep all follow-up visits as told by your health care provider and therapist. This is important. ?Alcohol Use ?Do not drink alcohol if: ?Your health care provider tells you not to drink. ?You are pregnant, may be pregnant, or are planning to become pregnant. ?If you drink alcohol, limit how much you have: ?0-1 drink a day for women. ?0-2 drinks a day for men. ?Be aware of how much alcohol is in your drink. In the [place] one drink equals one typical bottle of beer (12 oz), one-half glass of wine (5 oz), or one shot of hard liquor (1? oz). ?Contact a health care provider if you have: ?Constant pain. ?Weight loss. ?Difficulty or pain when swallowing. ?Diarrhea that gets worse. ?Get help right away if you have: ?Severe abdominal pain. ?Fever. ?Diarrhea with symptoms of dehydration, such as dizziness or dry mouth. ?Bright red blood in your stool. ?Stool that is black and tarry. ?Abdominal swelling. ?Vomiting that does not stop. ?Blood in your vomit. ?Summary ?Irritable bowel syndrome (IBS) is not one specific disease. It is a group of symptoms that affects digestion. ?Your intestines may become more sensitive and overreact to certain things. This may be especially true when you eat certain foods or when you are under stress. ?There is no cure for IBS, but treatment can help relieve symptoms. ?This information is not intended to replace advice given to you by your health care provider. Make sure you discuss any questions you have with your health care provider. ?Document Revised: 11/03/2019 Document Reviewed: 11/10/2019 ?Elsevier Patient Education ? [place]. ? ?

## 2021-06-05 ENCOUNTER — Encounter: Payer: Self-pay | Admitting: Internal Medicine

## 2021-06-05 MED ORDER — HYOSCYAMINE SULFATE ER 0.375 MG PO TB12: 0.3750 mg | ORAL_TABLET | Freq: Two times a day (BID) | ORAL | 0 refills | Status: DC

## 2021-07-29 ENCOUNTER — Other Ambulatory Visit: Payer: Self-pay | Admitting: Internal Medicine

## 2021-07-29 DIAGNOSIS — E291 Testicular hypofunction: Secondary | ICD-10-CM | POA: Insufficient documentation

## 2021-07-30 ENCOUNTER — Other Ambulatory Visit: Payer: Self-pay | Admitting: Internal Medicine

## 2021-07-30 DIAGNOSIS — E291 Testicular hypofunction: Secondary | ICD-10-CM

## 2021-07-30 MED ORDER — TESTOSTERONE 40.5 MG/2.5GM (1.62%) TD GEL: 2.5000 g | Freq: Every day | TRANSDERMAL | 1 refills | Status: DC

## 2021-08-12 ENCOUNTER — Encounter: Payer: Self-pay | Admitting: Internal Medicine

## 2021-08-21 ENCOUNTER — Encounter: Payer: Self-pay | Admitting: Internal Medicine

## 2021-08-21 ENCOUNTER — Ambulatory Visit (INDEPENDENT_AMBULATORY_CARE_PROVIDER_SITE_OTHER): Payer: Medicare Other | Admitting: Internal Medicine

## 2021-08-21 VITALS — BP 136/82 | HR 59 | Temp 98.2°F | Resp 16 | Ht 71.0 in | Wt 197.0 lb

## 2021-08-21 DIAGNOSIS — I10 Essential (primary) hypertension: Secondary | ICD-10-CM | POA: Diagnosis not present

## 2021-08-21 DIAGNOSIS — E039 Hypothyroidism, unspecified: Secondary | ICD-10-CM | POA: Insufficient documentation

## 2021-08-21 DIAGNOSIS — N1831 Chronic kidney disease, stage 3a: Secondary | ICD-10-CM | POA: Diagnosis not present

## 2021-08-21 LAB — BASIC METABOLIC PANEL
BUN: 28 mg/dL — ABNORMAL HIGH (ref 6–23)
CO2: 31 mEq/L (ref 19–32)
Calcium: 9.8 mg/dL (ref 8.4–10.5)
Chloride: 101 mEq/L (ref 96–112)
Creatinine, Ser: 1.28 mg/dL (ref 0.40–1.50)
GFR: 54.78 mL/min — ABNORMAL LOW (ref >60.00)
Glucose, Bld: 97 mg/dL (ref 70–99)
Potassium: 4.1 mEq/L (ref 3.5–5.1)
Sodium: 140 mEq/L (ref 135–145)

## 2021-08-21 LAB — TSH: TSH: 1.68 u[IU]/mL (ref 0.35–5.50)

## 2021-08-21 MED ORDER — ATENOLOL 25 MG PO TABS: 25.0000 mg | ORAL_TABLET | Freq: Every day | ORAL | 0 refills | Status: DC

## 2021-08-21 MED ORDER — OLMESARTAN MEDOXOMIL 20 MG PO TABS: 20.0000 mg | ORAL_TABLET | Freq: Every day | ORAL | 0 refills | Status: DC

## 2021-08-21 NOTE — Progress Notes (Unsigned)
Subjective:  Patient ID: Daniel Marquez, male    DOB: [birth year]  Age: 76 y.o. MRN: [id number]  CC: No chief complaint on file.   HPI Daniel Marquez presents for ***  Outpatient Medications Prior to Visit  Medication Sig Dispense Refill   hyoscyamine (LEVBID) 0.375 MG 12 hr tablet Take 1 tablet (0.375 mg total) by mouth 2 (two) times daily. 180 tablet 0   levothyroxine (SYNTHROID, LEVOTHROID) 25 MCG tablet Take 25 mcg by mouth daily before breakfast.     Multiple Vitamin (MULTI-VITAMIN DAILY PO) Take by mouth.     Omega-3 Fatty Acids (FISH OIL) 1000 MG CAPS Take 1,000 capsules by mouth.     Red Yeast Rice 600 MG CAPS Take 600 mg by mouth.     Resveratrol 100 MG CAPS Take by mouth.     Testosterone 40.5 MG/2.5GM (1.62%) GEL Place 2.5 g onto the skin daily. 225 g 1   atenolol (TENORMIN) 100 MG tablet Take 100 mg by mouth daily.     losartan-hydrochlorothiazide (HYZAAR) 100-25 MG tablet Take 1 tablet by mouth daily.     famotidine (PEPCID) 40 MG tablet Take 40 mg by mouth daily.     finasteride (PROSCAR) 5 MG tablet Take 5 mg by mouth daily.     ipratropium (ATROVENT) 0.03 % nasal spray Place 2 sprays into both nostrils every 12 (twelve) hours. (Patient not taking: Reported on 08/21/2021) 30 mL 12   tadalafil (CIALIS) 5 MG tablet Take 5 mg by mouth daily as needed for erectile dysfunction. (Patient not taking: Reported on 08/21/2021)     traMADol (ULTRAM) 50 MG tablet SMARTSIG:0.5-1 Tablet(s) By Mouth PRN     No facility-administered medications prior to visit.    ROS Review of Systems  Objective:  BP 136/82 (BP Location: Right Arm, Patient Position: Sitting, Cuff Size: Large)   Pulse (!) 59   Temp 98.2 F (36.8 C) (Oral)   Resp 16   Ht '5\' 11"'$  (1.803 m)   Wt 197 lb (89.4 kg)   SpO2 95%   BMI 27.48 kg/m   BP Readings from Last 3 Encounters:  08/21/21 136/82  06/04/21 (!) 142/78  05/24/21 (!) 142/77    Wt Readings from Last 3 Encounters:  08/21/21 197 lb (89.4 kg)   06/04/21 199 lb (90.3 kg)  05/24/21 195 lb 3.2 oz (88.5 kg)    Physical Exam  Lab Results  Component Value Date   WBC 6.4 05/22/2021   HGB 16.7 05/22/2021   HCT 50.9 05/22/2021   PLT 210 05/22/2021   GLUCOSE 97 08/21/2021   CHOL 147 04/25/2021   TRIG 86 04/25/2021   HDL 58 04/25/2021   ALT 37 04/25/2021   AST 22 04/25/2021   NA 140 08/21/2021   K 4.1 08/21/2021   CL 101 08/21/2021   CREATININE 1.28 08/21/2021   BUN 28 (H) 08/21/2021   CO2 31 08/21/2021   TSH 1.68 08/21/2021    CT Abdomen Pelvis W Contrast  Result Date: 05/07/2021 CLINICAL DATA:  Unintended weight loss EXAM: CT ABDOMEN AND PELVIS WITH CONTRAST TECHNIQUE: Multidetector CT imaging of the abdomen and pelvis was performed using the standard protocol following bolus administration of intravenous contrast. RADIATION DOSE REDUCTION: This exam was performed according to the departmental dose-optimization program which includes automated exposure control, adjustment of the mA and/or kV according to patient size and/or use of iterative reconstruction technique. CONTRAST:  136m ISOVUE-300 IOPAMIDOL (ISOVUE-300) INJECTION 61% COMPARISON:  10/19/2015 FINDINGS: Lower chest: No  acute abnormality.  Small hiatal hernia. Hepatobiliary: Numerous low-density lesions throughout the liver, the largest in the right hepatic lobe measuring 8.2 cm compared with 7 cm previously. These appear to reflect simple cysts. Small layering stones in the gallbladder. Pancreas: No focal abnormality or ductal dilatation. Spleen: No focal abnormality.  Normal size. Adrenals/Urinary Tract: 1.8 cm low-density lesion in the midpole of the left kidney appears to reflect a simple cyst. 1.8 cm low-density lesion in the midpole of the right kidney also appears to reflect a simple cyst. No hydronephrosis. Adrenal glands and urinary bladder unremarkable. Stomach/Bowel: Sigmoid diverticulosis. No active diverticulitis. Stomach and small bowel decompressed,  unremarkable. Duodenal diverticulum in the 2/3 portion of the duodenum measures 3.7 cm. Vascular/Lymphatic: Aortic atherosclerosis. No evidence of aneurysm or adenopathy. Reproductive: No visible focal abnormality.  Penile implant noted. Other: No free fluid or free air. Musculoskeletal: No acute bony abnormality. Degenerative changes in the lumbar spine. Bilateral L5 pars defects noted. Subcutaneous soft tissue nodule along the skin surface in the left lateral abdominal wall measures 2.7 cm compared to 1.7 cm on prior study. This most likely reflects sebaceous cyst. IMPRESSION: Numerous low-density lesions throughout the liver measuring up to 8.2 cm. These appear to reflect simple cysts. Bilateral renal low-density lesions most compatible with cysts. Cholelithiasis. Sigmoid diverticulosis.  No active diverticulitis. Aortic atherosclerosis. No acute findings. Electronically Signed   By: Rolm Baptise M.D.   On: 05/07/2021 23:22    Assessment & Plan:   Diagnoses and all orders for this visit:  Primary hypertension -     Basic metabolic panel; Future -     atenolol (TENORMIN) 25 MG tablet; Take 1 tablet (25 mg total) by mouth daily. -     olmesartan (BENICAR) 20 MG tablet; Take 1 tablet (20 mg total) by mouth daily. -     Basic metabolic panel  Acquired hypothyroidism -     TSH; Future -     TSH  Stage 3a chronic kidney disease ([employer])   I have discontinued Tam E. Shuey's atenolol and losartan-hydrochlorothiazide. I am also having him start on atenolol and olmesartan. Additionally, I am having him maintain his levothyroxine, ipratropium, traMADol, Fish Oil, Red Yeast Rice, famotidine, tadalafil, Multiple Vitamin (MULTI-VITAMIN DAILY PO), finasteride, Resveratrol, hyoscyamine, and Testosterone.  Meds ordered this encounter  Medications   atenolol (TENORMIN) 25 MG tablet    Sig: Take 1 tablet (25 mg total) by mouth daily.    Dispense:  90 tablet    Refill:  0   olmesartan (BENICAR) 20 MG  tablet    Sig: Take 1 tablet (20 mg total) by mouth daily.    Dispense:  90 tablet    Refill:  0     Follow-up: Return in about 6 months (around 02/20/2022).  Scarlette Calico, MD

## 2021-08-21 NOTE — Patient Instructions (Signed)
Hypertension, Adult High blood pressure (hypertension) is when the force of blood pumping through the arteries is too strong. The arteries are the blood vessels that carry blood from the heart throughout the body. Hypertension forces the heart to work harder to pump blood and may cause arteries to become narrow or stiff. Untreated or uncontrolled hypertension can lead to a heart attack, heart failure, a stroke, kidney disease, and other problems. A blood pressure reading consists of a higher number over a lower number. Ideally, your blood pressure should be below 120/80. The first ("top") number is called the systolic pressure. It is a measure of the pressure in your arteries as your heart beats. The second ("bottom") number is called the diastolic pressure. It is a measure of the pressure in your arteries as the heart relaxes. What are the causes? The exact cause of this condition is not known. There are some conditions that result in high blood pressure. What increases the risk? Certain factors may make you more likely to develop high blood pressure. Some of these risk factors are under your control, including: Smoking. Not getting enough exercise or physical activity. Being overweight. Having too much fat, sugar, calories, or salt (sodium) in your diet. Drinking too much alcohol. Other risk factors include: Having a personal history of heart disease, diabetes, high cholesterol, or kidney disease. Stress. Having a family history of high blood pressure and high cholesterol. Having obstructive sleep apnea. Age. The risk increases with age. What are the signs or symptoms? High blood pressure may not cause symptoms. Very high blood pressure (hypertensive crisis) may cause: Headache. Fast or irregular heartbeats (palpitations). Shortness of breath. Nosebleed. Nausea and vomiting. Vision changes. Severe chest pain, dizziness, and seizures. How is this diagnosed? This condition is diagnosed by  measuring your blood pressure while you are seated, with your arm resting on a flat surface, your legs uncrossed, and your feet flat on the floor. The cuff of the blood pressure monitor will be placed directly against the skin of your upper arm at the level of your heart. Blood pressure should be measured at least twice using the same arm. Certain conditions can cause a difference in blood pressure between your right and left arms. If you have a high blood pressure reading during one visit or you have normal blood pressure with other risk factors, you may be asked to: Return on a different day to have your blood pressure checked again. Monitor your blood pressure at home for 1 week or longer. If you are diagnosed with hypertension, you may have other blood or imaging tests to help your health care provider understand your overall risk for other conditions. How is this treated? This condition is treated by making healthy lifestyle changes, such as eating healthy foods, exercising more, and reducing your alcohol intake. You may be referred for counseling on a healthy diet and physical activity. Your health care provider may prescribe medicine if lifestyle changes are not enough to get your blood pressure under control and if: Your systolic blood pressure is above 130. Your diastolic blood pressure is above 80. Your personal target blood pressure may vary depending on your medical conditions, your age, and other factors. Follow these instructions at home: Eating and drinking  Eat a diet that is high in fiber and potassium, and low in sodium, added sugar, and fat. An example of this eating plan is called the DASH diet. DASH stands for Dietary Approaches to Stop Hypertension. To eat this way: Eat   plenty of fresh fruits and vegetables. Try to fill one half of your plate at each meal with fruits and vegetables. Eat whole grains, such as whole-wheat pasta, brown rice, or whole-grain bread. Fill about one  fourth of your plate with whole grains. Eat or drink low-fat dairy products, such as skim milk or low-fat yogurt. Avoid fatty cuts of meat, processed or cured meats, and poultry with skin. Fill about one fourth of your plate with lean proteins, such as fish, chicken without skin, beans, eggs, or tofu. Avoid pre-made and processed foods. These tend to be higher in sodium, added sugar, and fat. Reduce your daily sodium intake. Many people with hypertension should eat less than 1,500 mg of sodium a day. Do not drink alcohol if: Your health care provider tells you not to drink. You are pregnant, may be pregnant, or are planning to become pregnant. If you drink alcohol: Limit how much you have to: 0-1 drink a day for women. 0-2 drinks a day for men. Know how much alcohol is in your drink. In the [place] one drink equals one 12 oz bottle of beer (355 mL), one 5 oz glass of wine (148 mL), or one 1 oz glass of hard liquor (44 mL). Lifestyle  Work with your health care provider to maintain a healthy body weight or to lose weight. Ask what an ideal weight is for you. Get at least 30 minutes of exercise that causes your heart to beat faster (aerobic exercise) most days of the week. Activities may include walking, swimming, or biking. Include exercise to strengthen your muscles (resistance exercise), such as Pilates or lifting weights, as part of your weekly exercise routine. Try to do these types of exercises for 30 minutes at least 3 days a week. Do not use any products that contain nicotine or tobacco. These products include cigarettes, chewing tobacco, and vaping devices, such as e-cigarettes. If you need help quitting, ask your health care provider. Monitor your blood pressure at home as told by your health care provider. Keep all follow-up visits. This is important. Medicines Take over-the-counter and prescription medicines only as told by your health care provider. Follow directions carefully. Blood  pressure medicines must be taken as prescribed. Do not skip doses of blood pressure medicine. Doing this puts you at risk for problems and can make the medicine less effective. Ask your health care provider about side effects or reactions to medicines that you should watch for. Contact a health care provider if you: Think you are having a reaction to a medicine you are taking. Have headaches that keep coming back (recurring). Feel dizzy. Have swelling in your ankles. Have trouble with your vision. Get help right away if you: Develop a severe headache or confusion. Have unusual weakness or numbness. Feel faint. Have severe pain in your chest or abdomen. Vomit repeatedly. Have trouble breathing. These symptoms may be an emergency. Get help right away. Call 911. Do not wait to see if the symptoms will go away. Do not drive yourself to the hospital. Summary Hypertension is when the force of blood pumping through your arteries is too strong. If this condition is not controlled, it may put you at risk for serious complications. Your personal target blood pressure may vary depending on your medical conditions, your age, and other factors. For most people, a normal blood pressure is less than 120/80. Hypertension is treated with lifestyle changes, medicines, or a combination of both. Lifestyle changes include losing weight, eating a healthy,   low-sodium diet, exercising more, and limiting alcohol. This information is not intended to replace advice given to you by your health care provider. Make sure you discuss any questions you have with your health care provider. Document Revised: 01/15/2021 Document Reviewed: 01/15/2021 Elsevier Patient Education  2023 Else[employer] Inc[employer]  

## 2021-08-23 ENCOUNTER — Encounter: Payer: Self-pay | Admitting: Internal Medicine

## 2021-08-25 ENCOUNTER — Encounter: Payer: Self-pay | Admitting: Internal Medicine

## 2021-08-28 ENCOUNTER — Other Ambulatory Visit: Payer: Self-pay | Admitting: Internal Medicine

## 2021-08-28 DIAGNOSIS — Z23 Encounter for immunization: Secondary | ICD-10-CM

## 2021-08-28 MED ORDER — SHINGRIX 50 MCG/0.5ML IM SUSR: 0.5000 mL | Freq: Once | INTRAMUSCULAR | 1 refills | Status: AC

## 2021-09-03 ENCOUNTER — Other Ambulatory Visit: Payer: Self-pay | Admitting: Internal Medicine

## 2021-09-03 ENCOUNTER — Encounter: Payer: Self-pay | Admitting: Internal Medicine

## 2021-09-03 ENCOUNTER — Ambulatory Visit: Payer: Medicare Other | Admitting: Internal Medicine

## 2021-09-03 DIAGNOSIS — K582 Mixed irritable bowel syndrome: Secondary | ICD-10-CM

## 2021-09-04 ENCOUNTER — Encounter: Payer: Self-pay | Admitting: Internal Medicine

## 2021-09-04 ENCOUNTER — Other Ambulatory Visit (HOSPITAL_COMMUNITY): Payer: Self-pay

## 2021-09-04 ENCOUNTER — Other Ambulatory Visit: Payer: Self-pay | Admitting: Internal Medicine

## 2021-09-04 DIAGNOSIS — E291 Testicular hypofunction: Secondary | ICD-10-CM

## 2021-09-04 MED ORDER — HYOSCYAMINE SULFATE ER 0.375 MG PO TB12
0.3750 mg | ORAL_TABLET | Freq: Two times a day (BID) | ORAL | 0 refills | Status: DC
Start: 2021-09-04 — End: 2021-09-06
  Filled 2021-09-04: qty 30, 15d supply, fill #0
  Filled 2021-09-05: qty 150, 75d supply, fill #0

## 2021-09-04 MED ORDER — TESTOSTERONE 12.5 MG/ACT (1%) TD GEL: 2.5000 g | Freq: Every day | TRANSDERMAL | 1 refills | Status: DC

## 2021-09-05 ENCOUNTER — Other Ambulatory Visit (HOSPITAL_COMMUNITY): Payer: Self-pay

## 2021-09-06 ENCOUNTER — Other Ambulatory Visit: Payer: Self-pay | Admitting: Internal Medicine

## 2021-09-06 ENCOUNTER — Other Ambulatory Visit (HOSPITAL_COMMUNITY): Payer: Self-pay

## 2021-09-06 DIAGNOSIS — K582 Mixed irritable bowel syndrome: Secondary | ICD-10-CM

## 2021-09-16 ENCOUNTER — Ambulatory Visit: Payer: Medicare Other | Admitting: Internal Medicine

## 2021-10-23 ENCOUNTER — Other Ambulatory Visit: Payer: Self-pay | Admitting: Internal Medicine

## 2021-10-23 DIAGNOSIS — I10 Essential (primary) hypertension: Secondary | ICD-10-CM

## 2021-12-30 ENCOUNTER — Ambulatory Visit (INDEPENDENT_AMBULATORY_CARE_PROVIDER_SITE_OTHER): Payer: Medicare Other | Admitting: Internal Medicine

## 2021-12-30 ENCOUNTER — Encounter: Payer: Self-pay | Admitting: Internal Medicine

## 2021-12-30 VITALS — BP 126/68 | HR 72 | Temp 98.5°F | Ht 71.0 in | Wt 200.0 lb

## 2021-12-30 DIAGNOSIS — L02212 Cutaneous abscess of back [any part, except buttock]: Secondary | ICD-10-CM | POA: Insufficient documentation

## 2021-12-30 NOTE — Patient Instructions (Signed)
Incision and Drainage Incision and drainage is a surgical procedure to open and drain a fluid-filled sac. The sac may be filled with pus, mucus, or blood. Examples of fluid-filled sacs that may need surgical drainage include cysts, skin infections (abscesses), and red lumps that develop from a ruptured cyst or a small abscess (boils). You may need this procedure if the affected area is large, painful, infected, or not healing well. Tell a health care provider about: Any allergies you have. All medicines you are taking, including vitamins, herbs, eye drops, creams, and over-the-counter medicines. Any problems you or family members have had with anesthetic medicines. Any blood disorders you have or have had. Any surgeries you have had. Any medical conditions you have or have had. Whether you are pregnant or may be pregnant. What are the risks? Generally, this is a safe procedure. However, problems may occur, including: Infection. Bleeding. Allergic reactions to medicines. Scarring. The cyst or abscess returns. Damage to nerves or vessels. What happens before the procedure? Medicine Ask your health care provider about: Changing or stopping your regular medicines. This is especially important if you are taking diabetes medicines or blood thinners. Taking medicines such as aspirin and ibuprofen. These medicines can thin your blood. Do not take these medicines unless your health care provider tells you to take them. Taking over-the-counter medicines, vitamins, herbs, and supplements. Tests You may have an exam or testing. These may include: Ultrasound or other imaging tests to see how large or deep the fluid-filled sac is. Blood tests to check for infection. General instructions Follow instructions from your health care provider about eating or drinking restrictions. Plan to have someone take you home from the hospital or clinic. Ask your health care provider whether a responsible adult  should care for you for at least 24 hours after you leave the hospital or clinic. This is important. You may get a tetanus shot. Ask your health care provider: How your surgery site will be marked or identified. What steps will be taken to help prevent infection. These may include: Removing hair at the surgery site. Washing skin with a germ-killing soap. Receiving antibiotic medicine. What happens during the procedure?  An IV may be inserted into one of your veins. You will be given one or more of the following: A medicine to help you relax (sedative). A medicine to numb the area (local anesthetic). A medicine to make you fall asleep (general anesthetic). An incision will be made in the top of the fluid-filled sac. Pus, blood, and mucus will be squeezed out, and a syringe or tube (drain) may be used to empty more fluid from the sac. Your health care provider will do one of the following. He or she may: Leave the drain in place for several weeks to drain more fluid. Stitch open the edges of the incision to make a long-term opening for drainage (marsupialization). The inside of the sac may be washed out (irrigated) with a sterile solution and packed with gauze before it is covered with a bandage (dressing). Your health care provider may do a culture test of the drainage fluid. The procedure may vary among health care providers and hospitals. What happens after the procedure? Your blood pressure, heart rate, breathing rate, and blood oxygen level will be monitored often until you leave the hospital or clinic. Do not drive for 24 hours if you were given a sedative during your procedure. Summary Incision and drainage is a surgical procedure to open and drain a fluid-filled  sac. The sac may be filled with pus, mucus, or blood. Before the procedure, you may be given antibiotic medicine to treat or help prevent infection. During the procedure, an incision will be made in the top of the  fluid-filled sac. Pus, blood, and mucus is squeezed out, and a syringe or tube (drain) may be used to empty more fluid from the sac. The inside of the sac may be washed out (irrigated) with a sterile solution and packed with gauze before it is covered with a bandage (dressing). This information is not intended to replace advice given to you by your health care provider. Make sure you discuss any questions you have with your health care provider. Document Revised: 06/13/2021 Document Reviewed: 12/20/2020 Elsevier Patient Education  [place].

## 2021-12-30 NOTE — Progress Notes (Unsigned)
Subjective:  Patient ID: Daniel Marquez, male    DOB: Nov 28, 1945  Age: 76 y.o. MRN: [id number]  CC: No chief complaint on file.   HPI Daniel Marquez presents for ***  Outpatient Medications Prior to Visit  Medication Sig Dispense Refill   atenolol (TENORMIN) 25 MG tablet TAKE 1 TABLET BY MOUTH DAILY 90 tablet 1   buprenorphine (SUBUTEX) 8 MG SUBL SL tablet Place under the tongue daily.     hyoscyamine (LEVBID) 0.375 MG 12 hr tablet TAKE ONE TABLET BY MOUTH TWICE A DAY 180 tablet 0   levothyroxine (SYNTHROID, LEVOTHROID) 25 MCG tablet Take 25 mcg by mouth daily before breakfast.     Multiple Vitamin (MULTI-VITAMIN DAILY PO) Take by mouth.     Omega-3 Fatty Acids (FISH OIL) 1000 MG CAPS Take 1,000 capsules by mouth.     Red Yeast Rice 600 MG CAPS Take 600 mg by mouth.     Resveratrol 100 MG CAPS Take by mouth.     Testosterone 12.5 MG/ACT (1%) GEL Place 2.5 g onto the skin daily. 225 g 1   finasteride (PROSCAR) 5 MG tablet Take 5 mg by mouth daily.     olmesartan (BENICAR) 20 MG tablet TAKE 1 TABLET BY MOUTH DAILY 90 tablet 1   No facility-administered medications prior to visit.    ROS Review of Systems  Objective:  BP 126/68   Pulse 72   Temp 98.5 F (36.9 C) (Oral)   Ht '5\' 11"'$  (1.803 m)   Wt 200 lb (90.7 kg)   SpO2 93%   BMI 27.89 kg/m   BP Readings from Last 3 Encounters:  12/30/21 126/68  08/21/21 136/82  06/04/21 (!) 142/78    Wt Readings from Last 3 Encounters:  12/30/21 200 lb (90.7 kg)  08/21/21 197 lb (89.4 kg)  06/04/21 199 lb (90.3 kg)    Physical Exam  Lab Results  Component Value Date   WBC 6.4 05/22/2021   HGB 16.7 05/22/2021   HCT 50.9 05/22/2021   PLT 210 05/22/2021   GLUCOSE 97 08/21/2021   CHOL 147 04/25/2021   TRIG 86 04/25/2021   HDL 58 04/25/2021   ALT 37 04/25/2021   AST 22 04/25/2021   NA 140 08/21/2021   K 4.1 08/21/2021   CL 101 08/21/2021   CREATININE 1.28 08/21/2021   BUN 28 (H) 08/21/2021   CO2 31 08/21/2021    TSH 1.68 08/21/2021   PSA 1.11 10/26/2020    CT Abdomen Pelvis W Contrast  Result Date: 05/07/2021 CLINICAL DATA:  Unintended weight loss EXAM: CT ABDOMEN AND PELVIS WITH CONTRAST TECHNIQUE: Multidetector CT imaging of the abdomen and pelvis was performed using the standard protocol following bolus administration of intravenous contrast. RADIATION DOSE REDUCTION: This exam was performed according to the departmental dose-optimization program which includes automated exposure control, adjustment of the mA and/or kV according to patient size and/or use of iterative reconstruction technique. CONTRAST:  136m ISOVUE-300 IOPAMIDOL (ISOVUE-300) INJECTION 61% COMPARISON:  10/19/2015 FINDINGS: Lower chest: No acute abnormality.  Small hiatal hernia. Hepatobiliary: Numerous low-density lesions throughout the liver, the largest in the right hepatic lobe measuring 8.2 cm compared with 7 cm previously. These appear to reflect simple cysts. Small layering stones in the gallbladder. Pancreas: No focal abnormality or ductal dilatation. Spleen: No focal abnormality.  Normal size. Adrenals/Urinary Tract: 1.8 cm low-density lesion in the midpole of the left kidney appears to reflect a simple cyst. 1.8 cm low-density lesion in the midpole  of the right kidney also appears to reflect a simple cyst. No hydronephrosis. Adrenal glands and urinary bladder unremarkable. Stomach/Bowel: Sigmoid diverticulosis. No active diverticulitis. Stomach and small bowel decompressed, unremarkable. Duodenal diverticulum in the 2/3 portion of the duodenum measures 3.7 cm. Vascular/Lymphatic: Aortic atherosclerosis. No evidence of aneurysm or adenopathy. Reproductive: No visible focal abnormality.  Penile implant noted. Other: No free fluid or free air. Musculoskeletal: No acute bony abnormality. Degenerative changes in the lumbar spine. Bilateral L5 pars defects noted. Subcutaneous soft tissue nodule along the skin surface in the left lateral  abdominal wall measures 2.7 cm compared to 1.7 cm on prior study. This most likely reflects sebaceous cyst. IMPRESSION: Numerous low-density lesions throughout the liver measuring up to 8.2 cm. These appear to reflect simple cysts. Bilateral renal low-density lesions most compatible with cysts. Cholelithiasis. Sigmoid diverticulosis.  No active diverticulitis. Aortic atherosclerosis. No acute findings. Electronically Signed   By: Rolm Baptise M.D.   On: 05/07/2021 23:22    Assessment & Plan:   Diagnoses and all orders for this visit:  Abscess of back -     WOUND CULTURE; Future -     WOUND CULTURE   I have discontinued Juanda Crumble E. Heroux's finasteride and olmesartan. I am also having him maintain his levothyroxine, Fish Oil, Red Yeast Rice, Multiple Vitamin (MULTI-VITAMIN DAILY PO), Resveratrol, Testosterone, hyoscyamine, atenolol, and buprenorphine.  No orders of the defined types were placed in this encounter.    Follow-up: Return in about 2 days (around 01/01/2022).  Scarlette Calico, MD

## 2022-01-01 ENCOUNTER — Ambulatory Visit (INDEPENDENT_AMBULATORY_CARE_PROVIDER_SITE_OTHER): Payer: Medicare Other | Admitting: Internal Medicine

## 2022-01-01 ENCOUNTER — Encounter: Payer: Self-pay | Admitting: Internal Medicine

## 2022-01-01 VITALS — BP 136/82 | HR 85 | Temp 98.0°F | Resp 16 | Ht 71.0 in | Wt 200.0 lb

## 2022-01-01 DIAGNOSIS — L02212 Cutaneous abscess of back [any part, except buttock]: Secondary | ICD-10-CM

## 2022-01-01 DIAGNOSIS — R222 Localized swelling, mass and lump, trunk: Secondary | ICD-10-CM

## 2022-01-01 NOTE — Progress Notes (Signed)
Subjective:  Patient ID: Daniel Marquez, male    DOB: Aug 10, 1945  Age: 76 y.o. MRN: [id number]  CC: Wound Check   HPI Daniel Marquez presents for f/up -  His mid back is doing well status post incision and drainage.  He comes in today to have the packing removed.  He denies nausea, vomiting, fever, or chills.  The culture is positive only for skin flora.  Outpatient Medications Prior to Visit  Medication Sig Dispense Refill   atenolol (TENORMIN) 25 MG tablet TAKE 1 TABLET BY MOUTH DAILY 90 tablet 1   buprenorphine (SUBUTEX) 8 MG SUBL SL tablet Place under the tongue daily.     hyoscyamine (LEVBID) 0.375 MG 12 hr tablet TAKE ONE TABLET BY MOUTH TWICE A DAY 180 tablet 0   levothyroxine (SYNTHROID, LEVOTHROID) 25 MCG tablet Take 25 mcg by mouth daily before breakfast.     Multiple Vitamin (MULTI-VITAMIN DAILY PO) Take by mouth.     Omega-3 Fatty Acids (FISH OIL) 1000 MG CAPS Take 1,000 capsules by mouth.     Red Yeast Rice 600 MG CAPS Take 600 mg by mouth.     Resveratrol 100 MG CAPS Take by mouth.     Testosterone 12.5 MG/ACT (1%) GEL Place 2.5 g onto the skin daily. 225 g 1   No facility-administered medications prior to visit.    ROS Review of Systems  Constitutional: Negative.  Negative for diaphoresis and fatigue.  HENT: Negative.    Eyes: Negative.   Respiratory:  Negative for cough, chest tightness, shortness of breath and wheezing.   Cardiovascular:  Negative for chest pain, palpitations and leg swelling.  Gastrointestinal:  Negative for abdominal pain.  Endocrine: Negative.   Genitourinary: Negative.  Negative for difficulty urinating.  Musculoskeletal: Negative.   Skin:  Positive for wound.  Neurological:  Negative for dizziness and weakness.  Hematological:  Negative for adenopathy. Does not bruise/bleed easily.  Psychiatric/Behavioral: Negative.      Objective:  BP 136/82 (BP Location: Left Arm, Patient Position: Sitting, Cuff Size: Large)   Pulse 85   Temp  98 F (36.7 C) (Oral)   Resp 16   Ht '5\' 11"'$  (1.803 m)   Wt 200 lb (90.7 kg)   SpO2 95%   BMI 27.89 kg/m   BP Readings from Last 3 Encounters:  01/01/22 136/82  12/30/21 126/68  08/21/21 136/82    Wt Readings from Last 3 Encounters:  01/01/22 200 lb (90.7 kg)  12/30/21 200 lb (90.7 kg)  08/21/21 197 lb (89.4 kg)    Physical Exam Vitals reviewed.  HENT:     Nose: Nose normal.     Mouth/Throat:     Mouth: Mucous membranes are moist.  Eyes:     General: No scleral icterus.    Conjunctiva/sclera: Conjunctivae normal.  Cardiovascular:     Rate and Rhythm: Normal rate and regular rhythm.     Heart sounds: No murmur heard. Pulmonary:     Effort: Pulmonary effort is normal.     Breath sounds: No stridor. No wheezing, rhonchi or rales.  Abdominal:     General: Abdomen is flat.     Palpations: There is no mass.     Tenderness: There is no abdominal tenderness.  Musculoskeletal:     Cervical back: Neck supple.       Back:  Lymphadenopathy:     Cervical: No cervical adenopathy.  Skin:    General: Skin is warm and dry.  Neurological:  General: No focal deficit present.     Mental Status: He is alert.     Lab Results  Component Value Date   WBC 6.4 05/22/2021   HGB 16.7 05/22/2021   HCT 50.9 05/22/2021   PLT 210 05/22/2021   GLUCOSE 97 08/21/2021   CHOL 147 04/25/2021   TRIG 86 04/25/2021   HDL 58 04/25/2021   ALT 37 04/25/2021   AST 22 04/25/2021   NA 140 08/21/2021   K 4.1 08/21/2021   CL 101 08/21/2021   CREATININE 1.28 08/21/2021   BUN 28 (H) 08/21/2021   CO2 31 08/21/2021   TSH 1.68 08/21/2021   PSA 1.11 10/26/2020    CT Abdomen Pelvis W Contrast  Result Date: 05/07/2021 CLINICAL DATA:  Unintended weight loss EXAM: CT ABDOMEN AND PELVIS WITH CONTRAST TECHNIQUE: Multidetector CT imaging of the abdomen and pelvis was performed using the standard protocol following bolus administration of intravenous contrast. RADIATION DOSE REDUCTION: This exam was  performed according to the departmental dose-optimization program which includes automated exposure control, adjustment of the mA and/or kV according to patient size and/or use of iterative reconstruction technique. CONTRAST:  172m ISOVUE-300 IOPAMIDOL (ISOVUE-300) INJECTION 61% COMPARISON:  10/19/2015 FINDINGS: Lower chest: No acute abnormality.  Small hiatal hernia. Hepatobiliary: Numerous low-density lesions throughout the liver, the largest in the right hepatic lobe measuring 8.2 cm compared with 7 cm previously. These appear to reflect simple cysts. Small layering stones in the gallbladder. Pancreas: No focal abnormality or ductal dilatation. Spleen: No focal abnormality.  Normal size. Adrenals/Urinary Tract: 1.8 cm low-density lesion in the midpole of the left kidney appears to reflect a simple cyst. 1.8 cm low-density lesion in the midpole of the right kidney also appears to reflect a simple cyst. No hydronephrosis. Adrenal glands and urinary bladder unremarkable. Stomach/Bowel: Sigmoid diverticulosis. No active diverticulitis. Stomach and small bowel decompressed, unremarkable. Duodenal diverticulum in the 2/3 portion of the duodenum measures 3.7 cm. Vascular/Lymphatic: Aortic atherosclerosis. No evidence of aneurysm or adenopathy. Reproductive: No visible focal abnormality.  Penile implant noted. Other: No free fluid or free air. Musculoskeletal: No acute bony abnormality. Degenerative changes in the lumbar spine. Bilateral L5 pars defects noted. Subcutaneous soft tissue nodule along the skin surface in the left lateral abdominal wall measures 2.7 cm compared to 1.7 cm on prior study. This most likely reflects sebaceous cyst. IMPRESSION: Numerous low-density lesions throughout the liver measuring up to 8.2 cm. These appear to reflect simple cysts. Bilateral renal low-density lesions most compatible with cysts. Cholelithiasis. Sigmoid diverticulosis.  No active diverticulitis. Aortic atherosclerosis. No  acute findings. Electronically Signed   By: KRolm BaptiseM.D.   On: 05/07/2021 23:22    Assessment & Plan:   CBrendawas seen today for wound check.  Diagnoses and all orders for this visit:  Mass of skin of back -     Ambulatory referral to Plastic Surgery  Abscess of back- This has responded well to incision and drainage.  Antibiotics are not indicated.   I am having CDara Hoyermaintain his levothyroxine, Fish Oil, Red Yeast Rice, Multiple Vitamin (MULTI-VITAMIN DAILY PO), Resveratrol, Testosterone, hyoscyamine, atenolol, and buprenorphine.  No orders of the defined types were placed in this encounter.    Follow-up: Return in about 3 months (around 04/03/2022).  TScarlette Calico MD

## 2022-01-01 NOTE — Patient Instructions (Signed)

## 2022-01-02 LAB — WOUND CULTURE

## 2022-01-02 LAB — TIQ-NTM

## 2022-01-03 DIAGNOSIS — R222 Localized swelling, mass and lump, trunk: Secondary | ICD-10-CM | POA: Insufficient documentation

## 2022-01-16 ENCOUNTER — Encounter: Payer: Self-pay | Admitting: Internal Medicine

## 2022-01-16 DIAGNOSIS — I10 Essential (primary) hypertension: Secondary | ICD-10-CM

## 2022-01-16 MED ORDER — ATENOLOL 25 MG PO TABS: 25.0000 mg | ORAL_TABLET | Freq: Every day | ORAL | 1 refills | Status: DC

## 2022-02-05 ENCOUNTER — Telehealth: Payer: Self-pay | Admitting: Internal Medicine

## 2022-02-05 NOTE — Telephone Encounter (Signed)
Left message for patient to call back to schedule Medicare Annual Wellness Visit   No hx of AWV eligible as of 05/22/12  Please schedule at anytime with [facility] Advisor if patient calls the office back.     Any questions, please call me at [phone]

## 2022-02-11 ENCOUNTER — Telehealth: Payer: Self-pay | Admitting: Internal Medicine

## 2022-02-11 NOTE — Telephone Encounter (Signed)
LVM for pt to rtrtn my call to schedule AWV-I with [facility] call back # [phone]

## 2022-02-25 ENCOUNTER — Ambulatory Visit: Payer: Medicare Other | Admitting: Internal Medicine

## 2022-02-26 ENCOUNTER — Ambulatory Visit: Payer: Medicare Other | Admitting: Plastic Surgery

## 2022-02-26 ENCOUNTER — Encounter: Payer: Self-pay | Admitting: Plastic Surgery

## 2022-02-26 VITALS — BP 177/81 | HR 80 | Ht 71.0 in | Wt 201.0 lb

## 2022-02-26 DIAGNOSIS — D485 Neoplasm of uncertain behavior of skin: Secondary | ICD-10-CM

## 2022-02-26 DIAGNOSIS — D489 Neoplasm of uncertain behavior, unspecified: Secondary | ICD-10-CM

## 2022-02-26 NOTE — Progress Notes (Signed)
Referring Provider Janith Lima, MD [place],  [place] [address]   CC:  Chief Complaint  Patient presents with   Advice Only      Daniel Marquez is an 76 y.o. male.  HPI: Daniel Marquez is a very pleasant 76 year old male who is referred to the clinic for evaluation of a 3 x 3 cm mass on his left flank she states has been there for several years but has been slowly growing over the past few months.  He says that it is uncomfortable when pressed.  He would like to have it removed.  Allergies  Allergen Reactions   Codeine Other (See Comments)    Syncope    Outpatient Encounter Medications as of 02/26/2022  Medication Sig   atenolol (TENORMIN) 25 MG tablet Take 1 tablet (25 mg total) by mouth daily.   buprenorphine (SUBUTEX) 8 MG SUBL SL tablet Place under the tongue daily.   hyoscyamine (LEVBID) 0.375 MG 12 hr tablet TAKE ONE TABLET BY MOUTH TWICE A DAY   levothyroxine (SYNTHROID, LEVOTHROID) 25 MCG tablet Take 25 mcg by mouth daily before breakfast.   losartan-hydrochlorothiazide (HYZAAR) 100-25 MG tablet Take 1 tablet by mouth daily.   Multiple Vitamin (MULTI-VITAMIN DAILY PO) Take by mouth.   Omega-3 Fatty Acids (FISH OIL) 1000 MG CAPS Take 1,000 capsules by mouth.   Red Yeast Rice 600 MG CAPS Take 600 mg by mouth.   Resveratrol 100 MG CAPS Take by mouth.   Testosterone 12.5 MG/ACT (1%) GEL Place 2.5 g onto the skin daily.   Testosterone 20.25 MG/1.25GM (1.62%) GEL APPLY 1 PUMP TO SKIN DAILY FOR TESTOSTERONE (APPLY AFTER SHOWER OR BATH TO CLEAN, DRY, INTACT SKIN ON UPPER ARM OR SHOULDER AREAS ONLY)   No facility-administered encounter medications on file as of 02/26/2022.     Past Medical History:  Diagnosis Date   Hypertension    Thyroid disease     Past Surgical History:  Procedure Laterality Date   CYST EXCISION     JOINT REPLACEMENT     SHOULDER ARTHROSCOPY     TENDON REPAIR     TONSILLECTOMY      Family History  Problem Relation Age of Onset    Cancer Father    Diabetes Father    Hypertension Father    Heart attack Father     Social History   Social History Narrative   Not on file     Review of Systems General: Denies fevers, chills, weight loss CV: Denies chest pain, shortness of breath, palpitations Skin: Soft mass on the left flank.  No erythema or drainage from the mass.  Physical Exam    02/26/2022    1:55 PM 01/01/2022    2:18 PM 12/30/2021    2:14 PM  Vitals with BMI  Height '5\' 11"'$  '5\' 11"'$  '5\' 11"'$   Weight 201 lbs 200 lbs 200 lbs  BMI 28.05 29.93 71.69  Systolic 678 938 101  Diastolic 81 82 68  Pulse 80 85 72    General:  No acute distress,  Alert and oriented, Non-Toxic, Normal speech and affect Integument: 3 x 3 cm soft mass which does not appear to be fixed to the underlying tissues.  Assessment/Plan Neoplasm of uncertain pathology: This feels like a lipoma though it certainly could be an epidermal inclusion cyst.  I have discussed with him the possibility of doing this under local in the clinic.  I think this is a reasonable plan and he  agrees.  Schedule at the earliest available appointment.  [name] 02/26/2022, 2:34 PM

## 2022-03-05 ENCOUNTER — Telehealth: Payer: Self-pay | Admitting: Plastic Surgery

## 2022-03-05 ENCOUNTER — Encounter: Payer: Self-pay | Admitting: Plastic Surgery

## 2022-03-05 ENCOUNTER — Ambulatory Visit (INDEPENDENT_AMBULATORY_CARE_PROVIDER_SITE_OTHER): Payer: Medicare Other | Admitting: Plastic Surgery

## 2022-03-05 VITALS — BP 172/82 | HR 65

## 2022-03-05 DIAGNOSIS — L720 Epidermal cyst: Secondary | ICD-10-CM | POA: Diagnosis not present

## 2022-03-05 DIAGNOSIS — D489 Neoplasm of uncertain behavior, unspecified: Secondary | ICD-10-CM

## 2022-03-05 NOTE — Telephone Encounter (Signed)
LVM and My chart message that a sooner date and time has became available and to please contact our office sooner if he would like one of those times.

## 2022-03-05 NOTE — Progress Notes (Signed)
Procedure Note  Preoperative Dx: Cystic lesion left flank  Postoperative Dx: Same  Procedure: Excision of cystic lesion  Anesthesia: Lidocaine 1% with 1:100,000 epinephrine and 0.25% Sensorcaine   Indication for Procedure: Slow-growing lesion causing discomfort, removal for pathologic diagnosis  Description of Procedure: Risks and complications were explained to the patient who indicated understanding.  Consent was confirmed and the patient understands the risks and benefits.  The potential complications and alternatives were explained and the patient consents.  The patient expressed understanding the option of not having the procedure and the risks of a scar.  Time out was called and all information was confirmed to be correct.    The area was prepped and drapped.  Local anesthetic was injected in the subcutaneous tissues.  After waiting for the local to take affect a transverse incision was made over the lesion and a combination of sharp and blunt dissection was used to remove the cystic mass.  A large amount of sebum was removed from the mass and the entire cyst wall was excised.  After obtaining hemostasis, the surgical wound was closed with 4-0 Monocryl sutures in the deep layer to help prevent bleeding.  4-0 Monocryl sutures were also placed in the dermis and a 4-0 Prolene was used to run the length of the incision..  The surgical wound measured 3 cm. .  A dressing was applied.  The patient was given instructions on how to care for the area and a follow up appointment.  Bj tolerated the procedure well and there were no complications. The specimen was sent to pathology.

## 2022-03-11 NOTE — Progress Notes (Unsigned)
Patient is a pleasant 76 year old male with PMH of subcutaneous mass left flank s/p excision performed 03/05/2022 in office by Dr. Lovena Le who presents to clinic for postprocedural follow-up.  Reviewed procedural note and the cystic mass was excised and the excision site was closed with 4-0 Monocryl deep and 4-0 Prolene for skin.  Pathology has not yet resulted.  Today,

## 2022-03-12 ENCOUNTER — Ambulatory Visit: Payer: Medicare Other | Admitting: Physician Assistant

## 2022-03-12 DIAGNOSIS — D489 Neoplasm of uncertain behavior, unspecified: Secondary | ICD-10-CM

## 2022-03-12 DIAGNOSIS — L720 Epidermal cyst: Secondary | ICD-10-CM

## 2022-04-01 ENCOUNTER — Ambulatory Visit: Payer: Medicare Other | Admitting: Plastic Surgery

## 2022-04-03 ENCOUNTER — Ambulatory Visit: Payer: Medicare Other | Admitting: Internal Medicine

## 2022-04-11 ENCOUNTER — Encounter: Payer: Self-pay | Admitting: Internal Medicine

## 2022-04-11 ENCOUNTER — Other Ambulatory Visit: Payer: Self-pay | Admitting: Internal Medicine

## 2022-04-11 DIAGNOSIS — E291 Testicular hypofunction: Secondary | ICD-10-CM

## 2022-04-11 DIAGNOSIS — N1831 Chronic kidney disease, stage 3a: Secondary | ICD-10-CM

## 2022-04-11 DIAGNOSIS — E039 Hypothyroidism, unspecified: Secondary | ICD-10-CM

## 2022-04-11 DIAGNOSIS — I10 Essential (primary) hypertension: Secondary | ICD-10-CM

## 2022-04-11 NOTE — Progress Notes (Unsigned)
Lab Results  Component Value Date   WBC 6.4 05/22/2021   HGB 16.7 05/22/2021   HCT 50.9 05/22/2021   PLT 210 05/22/2021   GLUCOSE 97 08/21/2021   CHOL 147 04/25/2021   TRIG 86 04/25/2021   HDL 58 04/25/2021   ALT 37 04/25/2021   AST 22 04/25/2021   NA 140 08/21/2021   K 4.1 08/21/2021   CL 101 08/21/2021   CREATININE 1.28 08/21/2021   BUN 28 (H) 08/21/2021   CO2 31 08/21/2021   TSH 1.68 08/21/2021   PSA 1.11 10/26/2020

## 2022-04-16 ENCOUNTER — Encounter: Payer: Self-pay | Admitting: Internal Medicine

## 2022-04-16 ENCOUNTER — Ambulatory Visit: Payer: Medicare PPO | Admitting: Internal Medicine

## 2022-04-16 VITALS — BP 142/76 | HR 65 | Temp 98.1°F | Ht 71.0 in | Wt 200.0 lb

## 2022-04-16 DIAGNOSIS — I10 Essential (primary) hypertension: Secondary | ICD-10-CM | POA: Diagnosis not present

## 2022-04-16 DIAGNOSIS — L508 Other urticaria: Secondary | ICD-10-CM | POA: Diagnosis not present

## 2022-04-16 DIAGNOSIS — Z0001 Encounter for general adult medical examination with abnormal findings: Secondary | ICD-10-CM | POA: Insufficient documentation

## 2022-04-16 DIAGNOSIS — Z0000 Encounter for general adult medical examination without abnormal findings: Secondary | ICD-10-CM | POA: Diagnosis not present

## 2022-04-16 DIAGNOSIS — N1831 Chronic kidney disease, stage 3a: Secondary | ICD-10-CM

## 2022-04-16 DIAGNOSIS — E039 Hypothyroidism, unspecified: Secondary | ICD-10-CM | POA: Diagnosis not present

## 2022-04-16 LAB — HEPATIC FUNCTION PANEL
ALT: 26 U/L (ref 0–53)
AST: 27 U/L (ref 0–37)
Albumin: 4.3 g/dL (ref 3.5–5.2)
Alkaline Phosphatase: 41 U/L (ref 39–117)
Bilirubin, Direct: 0.1 mg/dL (ref 0.0–0.3)
Total Bilirubin: 0.6 mg/dL (ref 0.2–1.2)
Total Protein: 7 g/dL (ref 6.0–8.3)

## 2022-04-16 LAB — CBC WITH DIFFERENTIAL/PLATELET
Basophils Absolute: 0 10*3/uL (ref 0.0–0.1)
Basophils Relative: 0.6 % (ref 0.0–3.0)
Eosinophils Absolute: 0.3 10*3/uL (ref 0.0–0.7)
Eosinophils Relative: 4.7 % (ref 0.0–5.0)
HCT: 44.5 % (ref 39.0–52.0)
Hemoglobin: 15.2 g/dL (ref 13.0–17.0)
Lymphocytes Relative: 30.6 % (ref 12.0–46.0)
Lymphs Abs: 1.7 10*3/uL (ref 0.7–4.0)
MCHC: 34.1 g/dL (ref 30.0–36.0)
MCV: 93.1 fl (ref 78.0–100.0)
Monocytes Absolute: 0.7 10*3/uL (ref 0.1–1.0)
Monocytes Relative: 11.9 % (ref 3.0–12.0)
Neutro Abs: 2.9 10*3/uL (ref 1.4–7.7)
Neutrophils Relative %: 52.2 % (ref 43.0–77.0)
Platelets: 245 10*3/uL (ref 150.0–400.0)
RBC: 4.78 Mil/uL (ref 4.22–5.81)
RDW: 13.3 % (ref 11.5–15.5)
WBC: 5.6 10*3/uL (ref 4.0–10.5)

## 2022-04-16 LAB — BASIC METABOLIC PANEL
BUN: 31 mg/dL — ABNORMAL HIGH (ref 6–23)
CO2: 30 mEq/L (ref 19–32)
Calcium: 9 mg/dL (ref 8.4–10.5)
Chloride: 102 mEq/L (ref 96–112)
Creatinine, Ser: 1.42 mg/dL (ref 0.40–1.50)
GFR: 48.15 mL/min — ABNORMAL LOW (ref >60.00)
Glucose, Bld: 97 mg/dL (ref 70–99)
Potassium: 3.9 mEq/L (ref 3.5–5.1)
Sodium: 142 mEq/L (ref 135–145)

## 2022-04-16 LAB — C-REACTIVE PROTEIN: CRP: 1.6 mg/dL (ref 0.5–20.0)

## 2022-04-16 LAB — TSH: TSH: 3.66 u[IU]/mL (ref 0.35–5.50)

## 2022-04-16 MED ORDER — DOXEPIN HCL 10 MG PO CAPS: 10.0000 mg | ORAL_CAPSULE | Freq: Every day | ORAL | 0 refills | Status: DC

## 2022-04-16 MED ORDER — METHYLPREDNISOLONE ACETATE 80 MG/ML IJ SUSP
120.0000 mg | Freq: Once | INTRAMUSCULAR | Status: AC
  Administered 2022-04-16: 120 mg via INTRAMUSCULAR

## 2022-04-16 NOTE — Patient Instructions (Signed)
Hives Hives are itchy, red, swollen areas on your skin. Hives can show up on any part of your body. Hives often fade within 24 hours (acute hives). New hives can show up after old ones fade. This can go on for many days or weeks (chronic hives). Hives do not spread from person to person (are not contagious). Hives are caused by your body's response to something that you are allergic to (allergen). These are sometimes called triggers. You can get hives right after being around a trigger, or hours later. What are the causes? Allergies to foods. Insect bites or stings. Exposure to pollen or pets. Spending time in sunlight, heat, or cold. Exercise. Stress. You can also get hives from other medical conditions and treatments, such as: Some medicines. Chemicals or latex. Viruses. This includes the common cold. Infections caused by germs (bacteria). Allergy shots. Blood transfusions. Sometimes, the cause is not known. What increases the risk? Being a woman. Being allergic to foods such as: Citrus fruits. Milk. Eggs. Peanuts. Tree nuts. Shellfish. Being allergic to: Medicines. Latex. Insects. Animals. Pollen. What are the signs or symptoms?  Raised, itchy, red or white bumps or patches on your skin. These areas may: Get large and swollen. Change in shape and location. Stand alone or connect to each other over a large area of skin. Sting or hurt. Turn white when pressed in the center (blanch). In very bad cases, your hands, feet, and face may also get swollen. This may happen if hives start deeper in your skin. How is this treated? Treatment for this condition depends on your symptoms. Treatment may include: Using cool, wet cloths (cool compresses) or taking cool showers to stop the itching. Medicines that help: Relieve itching (antihistamines). Reduce swelling (corticosteroids). Treat infection (antibiotics). A medicine (omalizumab) that is given as a shot (injection). Your  doctor may prescribe this if you have hives that do not get better even after other treatments. In very bad cases, you may need a shot of a medicine called epinephrine to prevent a life-threatening allergic reaction (anaphylaxis). Follow these instructions at home: Medicines Take or apply over-the-counter and prescription medicines only as told by your doctor. If you were prescribed an antibiotic medicine, use it as told by your doctor. Do not stop using it even if you start to feel better. Skin care Apply cool, wet cloths to the hives. Do not scratch your skin. Do not rub your skin. General instructions Do not take hot showers or baths. This can make itching worse. Do not wear tight clothes. Use sunscreen and wear clothes that cover your skin when you are outside. Avoid any triggers that cause your hives. Keep a journal to help track what causes your hives. Write down: What medicines you take. What you eat and drink. What products you use on your skin. Keep all follow-up visits as told by your doctor. This is important. Contact a doctor if: Your symptoms are not better with medicine. Your joints hurt or are swollen. Get help right away if: You have a fever. You have pain in your belly (abdomen). Your tongue or lips are swollen. Your eyelids are swollen. Your chest or throat feels tight. You have trouble breathing or swallowing. These symptoms may be an emergency. Do not wait to see if the symptoms will go away. Get medical help right away. Call your local emergency services (911 in the [place] Do not drive yourself to the hospital. Summary Hives are itchy, red, swollen areas on your skin. Treatment  for this condition depends on your symptoms. Avoid things that cause your hives. Keep a journal to help track what causes your hives. Take and apply over-the-counter and prescription medicines only as told by your doctor. Get help right away if your chest or throat feels tight or if you  have trouble breathing or swallowing. This information is not intended to replace advice given to you by your health care provider. Make sure you discuss any questions you have with your health care provider. Document Revised: 04/27/2020 Document Reviewed: 04/29/2020 Elsevier Patient Education  [place].

## 2022-04-16 NOTE — Progress Notes (Signed)
Subjective:  Patient ID: Daniel Marquez, male    DOB: 31-Jul-1945  Age: 77 y.o. MRN: [id number]  CC: Annual Exam and Rash   HPI Daniel Marquez presents for a CPX and f/up -   He does 5 miles a day on an elliptical and has good endurance.  He denies chest pain, shortness of breath, diaphoresis, or edema.  He complains of a 62-monthhistory of itchy, red rash that comes and goes from different parts of his body.  Outpatient Medications Prior to Visit  Medication Sig Dispense Refill   atenolol (TENORMIN) 25 MG tablet Take 1 tablet (25 mg total) by mouth daily. (Patient taking differently: Take 10 mg by mouth daily.) 90 tablet 1   buprenorphine (SUBUTEX) 8 MG SUBL SL tablet Place under the tongue daily.     hyoscyamine (LEVBID) 0.375 MG 12 hr tablet TAKE ONE TABLET BY MOUTH TWICE A DAY 180 tablet 0   levothyroxine (SYNTHROID, LEVOTHROID) 25 MCG tablet Take 25 mcg by mouth daily before breakfast.     losartan-hydrochlorothiazide (HYZAAR) 100-25 MG tablet Take 1 tablet by mouth daily.     Multiple Vitamin (MULTI-VITAMIN DAILY PO) Take by mouth.     Omega-3 Fatty Acids (FISH OIL) 1000 MG CAPS Take 1,000 capsules by mouth.     Red Yeast Rice 600 MG CAPS Take 600 mg by mouth.     Resveratrol 100 MG CAPS Take by mouth.     Testosterone 20.25 MG/1.25GM (1.62%) GEL APPLY 1 PUMP TO SKIN DAILY FOR TESTOSTERONE (APPLY AFTER SHOWER OR BATH TO CLEAN, DRY, INTACT SKIN ON UPPER ARM OR SHOULDER AREAS ONLY)     No facility-administered medications prior to visit.    ROS Review of Systems  Constitutional:  Negative for chills, diaphoresis, fatigue and fever.  HENT: Negative.  Negative for sore throat.   Eyes: Negative.   Respiratory:  Negative for cough, chest tightness, shortness of breath and wheezing.   Cardiovascular:  Negative for chest pain, palpitations and leg swelling.  Gastrointestinal:  Negative for abdominal pain, diarrhea, nausea and vomiting.  Endocrine: Negative.    Genitourinary: Negative.  Negative for difficulty urinating.  Musculoskeletal:  Positive for arthralgias. Negative for back pain, joint swelling and myalgias.  Skin:  Positive for color change and rash. Negative for pallor and wound.  Neurological: Negative.  Negative for dizziness, weakness and headaches.  Hematological:  Negative for adenopathy. Does not bruise/bleed easily.  Psychiatric/Behavioral:  Positive for sleep disturbance. Negative for confusion, decreased concentration, dysphoric mood and suicidal ideas. The patient is not nervous/anxious.     Objective:  BP (!) 142/76 (BP Location: Left Arm, Patient Position: Sitting, Cuff Size: Large)   Pulse 65   Temp 98.1 F (36.7 C) (Oral)   Ht '5\' 11"'$  (1.803 m)   Wt 200 lb (90.7 kg)   SpO2 96%   BMI 27.89 kg/m   BP Readings from Last 3 Encounters:  04/16/22 (!) 142/76  03/05/22 (!) 172/82  02/26/22 (!) 177/81    Wt Readings from Last 3 Encounters:  04/16/22 200 lb (90.7 kg)  02/26/22 201 lb (91.2 kg)  01/01/22 200 lb (90.7 kg)    Physical Exam Vitals reviewed.  HENT:     Nose: Nose normal.     Mouth/Throat:     Mouth: Mucous membranes are moist.  Eyes:     General: No scleral icterus.    Conjunctiva/sclera: Conjunctivae normal.  Cardiovascular:     Rate and Rhythm: Normal  rate and regular rhythm.     Heart sounds: No murmur heard. Pulmonary:     Effort: Pulmonary effort is normal.     Breath sounds: No stridor. No wheezing, rhonchi or rales.  Abdominal:     General: Abdomen is flat.     Palpations: There is no mass.     Tenderness: There is no abdominal tenderness. There is no guarding.     Hernia: No hernia is present.  Musculoskeletal:        General: Normal range of motion.     Cervical back: Neck supple.     Right lower leg: No edema.     Left lower leg: No edema.  Lymphadenopathy:     Cervical: No cervical adenopathy.  Skin:    General: Skin is warm and dry.     Findings: Erythema and rash present.  Rash is urticarial.     Comments: Faint red wheals and dermographism.  Neurological:     General: No focal deficit present.     Mental Status: He is alert. Mental status is at baseline.  Psychiatric:        Mood and Affect: Mood normal.        Behavior: Behavior normal.     Lab Results  Component Value Date   WBC 5.6 04/16/2022   HGB 15.2 04/16/2022   HCT 44.5 04/16/2022   PLT 245.0 04/16/2022   GLUCOSE 97 04/16/2022   CHOL 147 04/25/2021   TRIG 86 04/25/2021   HDL 58 04/25/2021   ALT 26 04/16/2022   AST 27 04/16/2022   NA 142 04/16/2022   K 3.9 04/16/2022   CL 102 04/16/2022   CREATININE 1.42 04/16/2022   BUN 31 (H) 04/16/2022   CO2 30 04/16/2022   TSH 3.66 04/16/2022   PSA 1.11 10/26/2020    CT Abdomen Pelvis W Contrast  Result Date: 05/07/2021 CLINICAL DATA:  Unintended weight loss EXAM: CT ABDOMEN AND PELVIS WITH CONTRAST TECHNIQUE: Multidetector CT imaging of the abdomen and pelvis was performed using the standard protocol following bolus administration of intravenous contrast. RADIATION DOSE REDUCTION: This exam was performed according to the departmental dose-optimization program which includes automated exposure control, adjustment of the mA and/or kV according to patient size and/or use of iterative reconstruction technique. CONTRAST:  127m ISOVUE-300 IOPAMIDOL (ISOVUE-300) INJECTION 61% COMPARISON:  10/19/2015 FINDINGS: Lower chest: No acute abnormality.  Small hiatal hernia. Hepatobiliary: Numerous low-density lesions throughout the liver, the largest in the right hepatic lobe measuring 8.2 cm compared with 7 cm previously. These appear to reflect simple cysts. Small layering stones in the gallbladder. Pancreas: No focal abnormality or ductal dilatation. Spleen: No focal abnormality.  Normal size. Adrenals/Urinary Tract: 1.8 cm low-density lesion in the midpole of the left kidney appears to reflect a simple cyst. 1.8 cm low-density lesion in the midpole of the right  kidney also appears to reflect a simple cyst. No hydronephrosis. Adrenal glands and urinary bladder unremarkable. Stomach/Bowel: Sigmoid diverticulosis. No active diverticulitis. Stomach and small bowel decompressed, unremarkable. Duodenal diverticulum in the 2/3 portion of the duodenum measures 3.7 cm. Vascular/Lymphatic: Aortic atherosclerosis. No evidence of aneurysm or adenopathy. Reproductive: No visible focal abnormality.  Penile implant noted. Other: No free fluid or free air. Musculoskeletal: No acute bony abnormality. Degenerative changes in the lumbar spine. Bilateral L5 pars defects noted. Subcutaneous soft tissue nodule along the skin surface in the left lateral abdominal wall measures 2.7 cm compared to 1.7 cm on prior study. This most likely reflects  sebaceous cyst. IMPRESSION: Numerous low-density lesions throughout the liver measuring up to 8.2 cm. These appear to reflect simple cysts. Bilateral renal low-density lesions most compatible with cysts. Cholelithiasis. Sigmoid diverticulosis.  No active diverticulitis. Aortic atherosclerosis. No acute findings. Electronically Signed   By: Rolm Baptise M.D.   On: 05/07/2021 23:22    Assessment & Plan:   Arvind was seen today for annual exam and rash.  Diagnoses and all orders for this visit:  Urticaria geographica- Labs are negative for secondary causes. -     doxepin (SINEQUAN) 10 MG capsule; Take 1 capsule (10 mg total) by mouth at bedtime. -     C-reactive protein; Future -     methylPREDNISolone acetate (DEPO-MEDROL) injection 120 mg -     C-reactive protein  Primary hypertension- His blood pressure is well-controlled. -     CBC with Differential/Platelet -     TSH -     Hepatic function panel  Acquired hypothyroidism- He is euthyroid. -     TSH  Stage 3a chronic kidney disease ([place])- His renal function is stable. -     Basic metabolic panel -     CBC with Differential/Platelet  Encounter for general adult medical  examination with abnormal findings- Exam completed, labs reviewed, vaccines reviewed, cancer screenings are up-to-date, patient education was given.   I am having Dara Hoyer start on doxepin. I am also having him maintain his levothyroxine, Fish Oil, Red Yeast Rice, Multiple Vitamin (MULTI-VITAMIN DAILY PO), Resveratrol, hyoscyamine, buprenorphine, atenolol, losartan-hydrochlorothiazide, and Testosterone. We administered methylPREDNISolone acetate.  Meds ordered this encounter  Medications   doxepin (SINEQUAN) 10 MG capsule    Sig: Take 1 capsule (10 mg total) by mouth at bedtime.    Dispense:  90 capsule    Refill:  0   methylPREDNISolone acetate (DEPO-MEDROL) injection 120 mg     Follow-up: Return in about 6 months (around 10/15/2022).  Scarlette Calico, MD

## 2022-06-02 ENCOUNTER — Telehealth: Payer: Self-pay

## 2022-06-02 NOTE — Telephone Encounter (Signed)
Called patient to schedule Medicare Annual Wellness Visit (AWV). Left message for patient to call back and schedule Medicare Annual Wellness Visit (AWV).  Last date of AWV: No HX of AWV please schedule AWV-I  Please schedule an appointment at any time with Nurse Health Advisor.    Norton Blizzard, [place] ([facility])  [facility] [phone]

## 2022-06-04 ENCOUNTER — Other Ambulatory Visit: Payer: Self-pay | Admitting: Internal Medicine

## 2022-06-04 DIAGNOSIS — K582 Mixed irritable bowel syndrome: Secondary | ICD-10-CM

## 2022-07-11 ENCOUNTER — Other Ambulatory Visit: Payer: Self-pay | Admitting: Internal Medicine

## 2022-07-11 DIAGNOSIS — L508 Other urticaria: Secondary | ICD-10-CM

## 2022-07-25 ENCOUNTER — Other Ambulatory Visit: Payer: Self-pay | Admitting: Internal Medicine

## 2022-07-25 DIAGNOSIS — L508 Other urticaria: Secondary | ICD-10-CM

## 2022-07-25 MED ORDER — METHYLPREDNISOLONE 4 MG PO TBPK: ORAL_TABLET | ORAL | 0 refills | Status: AC

## 2022-08-27 ENCOUNTER — Encounter: Payer: Self-pay | Admitting: Internal Medicine

## 2022-09-03 ENCOUNTER — Encounter: Payer: Self-pay | Admitting: Internal Medicine

## 2022-09-09 ENCOUNTER — Encounter: Payer: Self-pay | Admitting: Internal Medicine

## 2022-09-09 ENCOUNTER — Ambulatory Visit (INDEPENDENT_AMBULATORY_CARE_PROVIDER_SITE_OTHER): Payer: Medicare PPO | Admitting: Internal Medicine

## 2022-09-09 VITALS — BP 146/82 | HR 73 | Temp 97.8°F | Resp 16 | Ht 71.0 in | Wt 203.0 lb

## 2022-09-09 DIAGNOSIS — E039 Hypothyroidism, unspecified: Secondary | ICD-10-CM

## 2022-09-09 DIAGNOSIS — E785 Hyperlipidemia, unspecified: Secondary | ICD-10-CM

## 2022-09-09 DIAGNOSIS — N1831 Chronic kidney disease, stage 3a: Secondary | ICD-10-CM | POA: Diagnosis not present

## 2022-09-09 DIAGNOSIS — I10 Essential (primary) hypertension: Secondary | ICD-10-CM | POA: Diagnosis not present

## 2022-09-09 DIAGNOSIS — E291 Testicular hypofunction: Secondary | ICD-10-CM | POA: Diagnosis not present

## 2022-09-09 DIAGNOSIS — K582 Mixed irritable bowel syndrome: Secondary | ICD-10-CM

## 2022-09-09 DIAGNOSIS — K5904 Chronic idiopathic constipation: Secondary | ICD-10-CM

## 2022-09-09 DIAGNOSIS — Z125 Encounter for screening for malignant neoplasm of prostate: Secondary | ICD-10-CM

## 2022-09-09 MED ORDER — LUBIPROSTONE 24 MCG PO CAPS
24.0000 ug | ORAL_CAPSULE | Freq: Two times a day (BID) | ORAL | 0 refills | Status: DC
Start: 2022-09-09 — End: 2022-09-23

## 2022-09-09 NOTE — Patient Instructions (Signed)

## 2022-09-09 NOTE — Progress Notes (Unsigned)
Subjective:  Patient ID: Daniel Marquez, male    DOB: 1945-12-01  Age: 77 y.o. MRN: [id number]  CC: Hypertension and Hypothyroidism   HPI Daniel Marquez presents for f/up ----  Discussed the use of AI scribe software for clinical note transcription with the patient, who gave verbal consent to proceed.  History of Present Illness   The patient, with a history of hypertension, IBS, and urinary symptoms, presents with concerns about a delayed colonoscopy. [facility] three years ago due to the presence of polyps. The patient is frustrated with the delay in scheduling the procedure, which is now set for August. He complains of constipation but denies any blood in his stool. He has taken colon screening tests from the [place], which have been negative.  The patient is on spironolactone, hydrochlorothiazide, and atenolol for hypertension. He denies any symptoms of hypotension such as weakness, dizziness, or lightheadedness. He has been using an elliptical for exercise without any chest pain, shortness of breath, or cold sweats. He also reports a previous cardiac stress test, which was unremarkable.  The patient also reports urinary symptoms (urgency). He has seen a urologist recently, who reported that the prostate feels fine. The patient's thyroid level was checked at his last appointment at [facility], the date of which the patient does not recall. He denies any weight loss and reports a good appetite.       Outpatient Medications Prior to Visit  Medication Sig Dispense Refill   atenolol (TENORMIN) 25 MG tablet Take 1 tablet (25 mg total) by mouth daily. (Patient taking differently: Take 10 mg by mouth daily.) 90 tablet 1   buprenorphine (SUBUTEX) 8 MG SUBL SL tablet Place under the tongue daily.     doxepin (SINEQUAN) 10 MG capsule TAKE 1 CAPSULE BY MOUTH AT BEDTIME 90 capsule 0   levothyroxine (SYNTHROID, LEVOTHROID) 25 MCG tablet Take 25 mcg by  mouth daily before breakfast.     losartan-hydrochlorothiazide (HYZAAR) 100-25 MG tablet Take 1 tablet by mouth daily.     Multiple Vitamin (MULTI-VITAMIN DAILY PO) Take by mouth.     Omega-3 Fatty Acids (FISH OIL) 1000 MG CAPS Take 1,000 capsules by mouth.     Red Yeast Rice 600 MG CAPS Take 600 mg by mouth.     Resveratrol 100 MG CAPS Take by mouth.     hyoscyamine (LEVBID) 0.375 MG 12 hr tablet TAKE 1 TABLET BY MOUTH TWICE A DAY 180 tablet 0   Testosterone 20.25 MG/1.25GM (1.62%) GEL APPLY 1 PUMP TO SKIN DAILY FOR TESTOSTERONE (APPLY AFTER SHOWER OR BATH TO CLEAN, DRY, INTACT SKIN ON UPPER ARM OR SHOULDER AREAS ONLY)     No facility-administered medications prior to visit.    ROS Review of Systems  Constitutional: Negative.  Negative for appetite change, chills, diaphoresis, fatigue and fever.  HENT: Negative.  Negative for trouble swallowing.   Respiratory: Negative.  Negative for cough, chest tightness, shortness of breath and wheezing.   Cardiovascular:  Negative for chest pain, palpitations and leg swelling.  Gastrointestinal:  Positive for constipation. Negative for abdominal pain, anal bleeding, blood in stool, diarrhea, nausea and vomiting.  Genitourinary: Negative.  Negative for difficulty urinating.  Musculoskeletal:  Positive for arthralgias, back pain and gait problem.  Skin: Negative.   Neurological:  Negative for dizziness, weakness, light-headedness and headaches.  Hematological:  Negative for adenopathy. Does not bruise/bleed easily.  Psychiatric/Behavioral: Negative.  Objective:  BP (!) 146/82 (BP Location: Left Arm, Patient Position: Sitting, Cuff Size: Large)   Pulse 73   Temp 97.8 F (36.6 C) (Oral)   Resp 16   Ht 5\' 11"  (1.803 m)   Wt 203 lb (92.1 kg)   SpO2 95%   BMI 28.31 kg/m   BP Readings from Last 3 Encounters:  09/09/22 (!) 146/82  04/16/22 (!) 142/76  03/05/22 (!) 172/82    Wt Readings from Last 3 Encounters:  09/09/22 203 lb (92.1 kg)   04/16/22 200 lb (90.7 kg)  02/26/22 201 lb (91.2 kg)    Physical Exam Vitals reviewed.  Constitutional:      Appearance: Normal appearance.  HENT:     Nose: Nose normal.     Mouth/Throat:     Mouth: Mucous membranes are moist.  Eyes:     General: No scleral icterus.    Conjunctiva/sclera: Conjunctivae normal.  Cardiovascular:     Rate and Rhythm: Normal rate and regular rhythm.     Heart sounds: No murmur heard.    No friction rub. No gallop.  Pulmonary:     Effort: Pulmonary effort is normal.     Breath sounds: No stridor. No wheezing, rhonchi or rales.  Abdominal:     Palpations: There is no mass.     Tenderness: There is no abdominal tenderness. There is no guarding.     Hernia: No hernia is present.  Musculoskeletal:        General: Normal range of motion.     Cervical back: Neck supple.     Right lower leg: No edema.     Left lower leg: No edema.  Lymphadenopathy:     Cervical: No cervical adenopathy.  Skin:    General: Skin is warm and dry.  Neurological:     General: No focal deficit present.     Mental Status: He is alert. Mental status is at baseline.  Psychiatric:        Mood and Affect: Mood normal.        Behavior: Behavior normal.     Lab Results  Component Value Date   WBC 5.0 09/19/2022   HGB 15.7 09/19/2022   HCT 46.9 09/19/2022   PLT 242.0 09/19/2022   GLUCOSE 77 09/19/2022   CHOL 180 09/19/2022   TRIG 141.0 09/19/2022   HDL 48.60 09/19/2022   LDLCALC 104 (H) 09/19/2022   ALT 26 04/16/2022   AST 27 04/16/2022   NA 143 09/19/2022   K 4.3 09/19/2022   CL 101 09/19/2022   CREATININE 1.14 09/19/2022   BUN 25 (H) 09/19/2022   CO2 33 (H) 09/19/2022   TSH 2.51 09/19/2022   PSA 1.48 09/19/2022    CT Abdomen Pelvis W Contrast  Result Date: 05/07/2021 CLINICAL DATA:  Unintended weight loss EXAM: CT ABDOMEN AND PELVIS WITH CONTRAST TECHNIQUE: Multidetector CT imaging of the abdomen and pelvis was performed using the standard protocol  following bolus administration of intravenous contrast. RADIATION DOSE REDUCTION: This exam was performed according to the departmental dose-optimization program which includes automated exposure control, adjustment of the mA and/or kV according to patient size and/or use of iterative reconstruction technique. CONTRAST:  ISOVUE-300 IOPAMIDOL (ISOVUE-300) INJECTION 61% COMPARISON:  10/19/2015 FINDINGS: Lower chest: No acute abnormality.  Small hiatal hernia. Hepatobiliary: Numerous low-density lesions throughout the liver, the largest in the right hepatic lobe measuring 8.2 cm compared with 7 cm previously. These appear to reflect simple cysts. Small layering stones in the  gallbladder. Pancreas: No focal abnormality or ductal dilatation. Spleen: No focal abnormality.  Normal size. Adrenals/Urinary Tract: 1.8 cm low-density lesion in the midpole of the left kidney appears to reflect a simple cyst. 1.8 cm low-density lesion in the midpole of the right kidney also appears to reflect a simple cyst. No hydronephrosis. Adrenal glands and urinary bladder unremarkable. Stomach/Bowel: Sigmoid diverticulosis. No active diverticulitis. Stomach and small bowel decompressed, unremarkable. Duodenal diverticulum in the 2/3 portion of the duodenum measures 3.7 cm. Vascular/Lymphatic: Aortic atherosclerosis. No evidence of aneurysm or adenopathy. Reproductive: No visible focal abnormality.  Penile implant noted. Other: No free fluid or free air. Musculoskeletal: No acute bony abnormality. Degenerative changes in the lumbar spine. Bilateral L5 pars defects noted. Subcutaneous soft tissue nodule along the skin surface in the left lateral abdominal wall measures 2.7 cm compared to 1.7 cm on prior study. This most likely reflects sebaceous cyst. IMPRESSION: Numerous low-density lesions throughout the liver measuring up to 8.2 cm. These appear to reflect simple cysts. Bilateral renal low-density lesions most compatible with cysts.  Cholelithiasis. Sigmoid diverticulosis.  No active diverticulitis. Aortic atherosclerosis. No acute findings. Electronically Signed   By: Charlett Nose M.D.   On: 05/07/2021 23:22    Assessment & Plan:  Primary hypertension -     TSH; Future -     Basic metabolic panel; Future  Acquired hypothyroidism -     TSH; Future  Stage 3a chronic kidney disease (HCC) -     Basic metabolic panel; Future  Hypogonadism male -     CBC with Differential/Platelet; Future -     Testosterone Total,Free,Bio, Males; Future -     Testosterone; Apply 1 Act topically daily.  Dispense: 112.5 g; Refill: 1  Dyslipidemia, goal LDL below 100 -     Lipid panel; Future -     TSH; Future  Chronic idiopathic constipation -     Magnesium; Future -     TSH; Future -     Lubiprostone; Take 1 capsule (24 mcg total) by mouth 2 (two) times daily with a meal.  Dispense: 180 capsule; Refill: 0  Prostate cancer screening -     PSA; Future     Follow-up: Return in about 3 months (around 12/10/2022).  Sanda Linger, MD

## 2022-09-15 ENCOUNTER — Other Ambulatory Visit: Payer: Self-pay | Admitting: Internal Medicine

## 2022-09-15 ENCOUNTER — Encounter: Payer: Self-pay | Admitting: Internal Medicine

## 2022-09-15 NOTE — Telephone Encounter (Signed)
He needs to do the CBC first

## 2022-09-17 ENCOUNTER — Ambulatory Visit: Payer: Non-veteran care | Admitting: Internal Medicine

## 2022-09-19 ENCOUNTER — Other Ambulatory Visit (INDEPENDENT_AMBULATORY_CARE_PROVIDER_SITE_OTHER): Payer: Medicare PPO

## 2022-09-19 DIAGNOSIS — I10 Essential (primary) hypertension: Secondary | ICD-10-CM | POA: Diagnosis not present

## 2022-09-19 DIAGNOSIS — E291 Testicular hypofunction: Secondary | ICD-10-CM

## 2022-09-19 DIAGNOSIS — K5904 Chronic idiopathic constipation: Secondary | ICD-10-CM | POA: Diagnosis not present

## 2022-09-19 DIAGNOSIS — E785 Hyperlipidemia, unspecified: Secondary | ICD-10-CM | POA: Diagnosis not present

## 2022-09-19 DIAGNOSIS — Z125 Encounter for screening for malignant neoplasm of prostate: Secondary | ICD-10-CM

## 2022-09-19 DIAGNOSIS — E039 Hypothyroidism, unspecified: Secondary | ICD-10-CM | POA: Diagnosis not present

## 2022-09-19 DIAGNOSIS — N1831 Chronic kidney disease, stage 3a: Secondary | ICD-10-CM

## 2022-09-19 LAB — BASIC METABOLIC PANEL
BUN: 25 mg/dL — ABNORMAL HIGH (ref 6–23)
CO2: 33 mEq/L — ABNORMAL HIGH (ref 19–32)
Calcium: 10 mg/dL (ref 8.4–10.5)
Chloride: 101 mEq/L (ref 96–112)
Creatinine, Ser: 1.14 mg/dL (ref 0.40–1.50)
GFR: 62.48 mL/min (ref >60.00)
Glucose, Bld: 77 mg/dL (ref 70–99)
Potassium: 4.3 mEq/L (ref 3.5–5.1)
Sodium: 143 mEq/L (ref 135–145)

## 2022-09-19 LAB — CBC WITH DIFFERENTIAL/PLATELET
Basophils Absolute: 0 10*3/uL (ref 0.0–0.1)
Basophils Relative: 0.7 % (ref 0.0–3.0)
Eosinophils Absolute: 0.1 10*3/uL (ref 0.0–0.7)
Eosinophils Relative: 2.5 % (ref 0.0–5.0)
HCT: 46.9 % (ref 39.0–52.0)
Hemoglobin: 15.7 g/dL (ref 13.0–17.0)
Lymphocytes Relative: 24.4 % (ref 12.0–46.0)
Lymphs Abs: 1.2 10*3/uL (ref 0.7–4.0)
MCHC: 33.5 g/dL (ref 30.0–36.0)
MCV: 94.1 fl (ref 78.0–100.0)
Monocytes Absolute: 0.6 10*3/uL (ref 0.1–1.0)
Monocytes Relative: 11.5 % (ref 3.0–12.0)
Neutro Abs: 3 10*3/uL (ref 1.4–7.7)
Neutrophils Relative %: 60.9 % (ref 43.0–77.0)
Platelets: 242 10*3/uL (ref 150.0–400.0)
RBC: 4.98 Mil/uL (ref 4.22–5.81)
RDW: 13.5 % (ref 11.5–15.5)
WBC: 5 10*3/uL (ref 4.0–10.5)

## 2022-09-19 LAB — MAGNESIUM: Magnesium: 2.3 mg/dL (ref 1.5–2.5)

## 2022-09-19 LAB — LIPID PANEL
Cholesterol: 180 mg/dL (ref 0–200)
HDL: 48.6 mg/dL (ref >39.00)
LDL Cholesterol: 104 mg/dL — ABNORMAL HIGH (ref 0–99)
NonHDL: 131.77
Total CHOL/HDL Ratio: 4
Triglycerides: 141 mg/dL (ref 0.0–149.0)
VLDL: 28.2 mg/dL (ref 0.0–40.0)

## 2022-09-19 LAB — TSH: TSH: 2.51 u[IU]/mL (ref 0.35–5.50)

## 2022-09-19 LAB — PSA: PSA: 1.48 ng/mL (ref 0.10–4.00)

## 2022-09-19 MED ORDER — TESTOSTERONE 20.25 MG/1.25GM (1.62%) TD GEL
1.0000 | Freq: Every day | TRANSDERMAL | 1 refills | Status: DC
Start: 2022-09-19 — End: 2022-09-24

## 2022-09-20 DIAGNOSIS — K5904 Chronic idiopathic constipation: Secondary | ICD-10-CM | POA: Insufficient documentation

## 2022-09-20 DIAGNOSIS — E785 Hyperlipidemia, unspecified: Secondary | ICD-10-CM | POA: Insufficient documentation

## 2022-09-20 LAB — TESTOSTERONE TOTAL,FREE,BIO, MALES
Albumin: 4.2 g/dL (ref 3.6–5.1)
Sex Hormone Binding: 46 nmol/L (ref 22–77)
Testosterone, Bioavailable: 65.8 ng/dL (ref 15.0–150.0)
Testosterone, Free: 34.2 pg/mL (ref 6.0–73.0)
Testosterone: 347 ng/dL (ref 250–827)

## 2022-09-20 MED ORDER — ROSUVASTATIN CALCIUM 10 MG PO TABS
10.0000 mg | ORAL_TABLET | Freq: Every day | ORAL | 1 refills | Status: DC
Start: 2022-09-20 — End: 2022-10-29

## 2022-09-23 ENCOUNTER — Encounter: Payer: Self-pay | Admitting: Internal Medicine

## 2022-09-23 ENCOUNTER — Other Ambulatory Visit: Payer: Self-pay | Admitting: Internal Medicine

## 2022-09-23 DIAGNOSIS — E291 Testicular hypofunction: Secondary | ICD-10-CM

## 2022-09-23 DIAGNOSIS — K5904 Chronic idiopathic constipation: Secondary | ICD-10-CM

## 2022-09-23 MED ORDER — LUBIPROSTONE 24 MCG PO CAPS
24.0000 ug | ORAL_CAPSULE | Freq: Two times a day (BID) | ORAL | 1 refills | Status: DC
Start: 2022-09-23 — End: 2022-09-23

## 2022-09-23 MED ORDER — LUBIPROSTONE 24 MCG PO CAPS
24.0000 ug | ORAL_CAPSULE | Freq: Two times a day (BID) | ORAL | 1 refills | Status: DC
Start: 2022-09-23 — End: 2022-10-29

## 2022-09-24 ENCOUNTER — Other Ambulatory Visit: Payer: Self-pay | Admitting: Internal Medicine

## 2022-09-24 DIAGNOSIS — E291 Testicular hypofunction: Secondary | ICD-10-CM

## 2022-09-24 MED ORDER — TESTOSTERONE 20.25 MG/1.25GM (1.62%) TD GEL: 1.0000 | Freq: Every day | TRANSDERMAL | 1 refills | Status: DC

## 2022-09-26 ENCOUNTER — Telehealth: Payer: Self-pay

## 2022-09-26 NOTE — Telephone Encounter (Signed)
*  Primary  PA request received for Testosterone 20.25 MG/1.25GM(1.62%) gel  PA submitted to [facility] via CMM and is pending additional questions/determination  Key: [id number]

## 2022-10-09 ENCOUNTER — Encounter: Payer: Self-pay | Admitting: Internal Medicine

## 2022-10-10 NOTE — Telephone Encounter (Signed)
PA has been APPROVED from 09/26/2022-03/24/2023

## 2022-10-14 ENCOUNTER — Ambulatory Visit (INDEPENDENT_AMBULATORY_CARE_PROVIDER_SITE_OTHER): Payer: Medicare PPO | Admitting: Internal Medicine

## 2022-10-14 ENCOUNTER — Ambulatory Visit: Payer: Medicare PPO

## 2022-10-14 ENCOUNTER — Encounter: Payer: Self-pay | Admitting: Internal Medicine

## 2022-10-14 VITALS — BP 138/64 | HR 73 | Temp 97.9°F | Ht 71.0 in | Wt 204.0 lb

## 2022-10-14 DIAGNOSIS — I10 Essential (primary) hypertension: Secondary | ICD-10-CM | POA: Diagnosis not present

## 2022-10-14 DIAGNOSIS — L509 Urticaria, unspecified: Secondary | ICD-10-CM | POA: Diagnosis not present

## 2022-10-14 DIAGNOSIS — K5904 Chronic idiopathic constipation: Secondary | ICD-10-CM | POA: Diagnosis not present

## 2022-10-14 DIAGNOSIS — L508 Other urticaria: Secondary | ICD-10-CM

## 2022-10-14 DIAGNOSIS — E039 Hypothyroidism, unspecified: Secondary | ICD-10-CM | POA: Diagnosis not present

## 2022-10-14 MED ORDER — DOXEPIN HCL 10 MG PO CAPS
20.0000 mg | ORAL_CAPSULE | Freq: Every day | ORAL | 0 refills | Status: DC
Start: 2022-10-14 — End: 2023-01-15

## 2022-10-14 NOTE — Progress Notes (Signed)
Subjective:  Patient ID: Daniel Marquez, male    DOB: 03/09/1946  Age: 77 y.o. MRN: [id number]  CC: Hypertension   HPI Daniel Marquez presents for f/up ----  Discussed the use of AI scribe software for clinical note transcription with the patient, who gave verbal consent to proceed.  History of Present Illness   The patient, with a history of hypertension managed with spironolactone, hydrochlorothiazide, and atenolol, presents with a recent history of constipation. They deny any associated abdominal pain, nausea, vomiting, or dizziness. They also deny any concerns about their stool, such as blood in the stool.   He would like to increase the dose of doxepin.  He says the current dose has not been effective and it does not cause sedation or any other side effects.      Outpatient Medications Prior to Visit  Medication Sig Dispense Refill   atenolol (TENORMIN) 25 MG tablet Take 1 tablet (25 mg total) by mouth daily. (Patient taking differently: Take 10 mg by mouth daily.) 90 tablet 1   levothyroxine (SYNTHROID, LEVOTHROID) 25 MCG tablet Take 25 mcg by mouth daily before breakfast.     losartan-hydrochlorothiazide (HYZAAR) 100-25 MG tablet Take 1 tablet by mouth daily.     lubiprostone (AMITIZA) 24 MCG capsule Take 1 capsule (24 mcg total) by mouth 2 (two) times daily with a meal. 180 capsule 1   Multiple Vitamin (MULTI-VITAMIN DAILY PO) Take by mouth.     Omega-3 Fatty Acids (FISH OIL) 1000 MG CAPS Take 1,000 capsules by mouth.     Resveratrol 100 MG CAPS Take by mouth.     rosuvastatin (CRESTOR) 10 MG tablet Take 1 tablet (10 mg total) by mouth daily. 90 tablet 1   Testosterone 20.25 MG/1.25GM (1.62%) GEL Apply 1 Act topically daily. 112.5 g 1   doxepin (SINEQUAN) 10 MG capsule TAKE 1 CAPSULE BY MOUTH AT BEDTIME 90 capsule 0   No facility-administered medications prior to visit.    ROS Review of Systems  Constitutional:  Negative for chills, diaphoresis, fatigue and fever.   HENT: Negative.    Eyes: Negative.   Respiratory: Negative.  Negative for cough, chest tightness, shortness of breath and wheezing.   Cardiovascular:  Negative for chest pain, palpitations and leg swelling.  Gastrointestinal:  Positive for constipation. Negative for abdominal pain, nausea, rectal pain and vomiting.  Genitourinary: Negative.  Negative for difficulty urinating.  Musculoskeletal:  Positive for arthralgias and gait problem. Negative for myalgias and neck pain.  Skin:  Positive for rash. Negative for color change.       +++ itching, wife sees wheals, he thinks he is allergic to strawberries  Neurological:  Negative for dizziness, weakness, light-headedness and headaches.  Hematological:  Negative for adenopathy. Does not bruise/bleed easily.  Psychiatric/Behavioral: Negative.      Objective:  BP 138/64 (BP Location: Left Arm, Patient Position: Sitting, Cuff Size: Large)   Pulse 73   Temp 97.9 F (36.6 C) (Oral)   Ht 5\' 11"  (1.803 m)   Wt 204 lb (92.5 kg)   SpO2 90%   BMI 28.45 kg/m   BP Readings from Last 3 Encounters:  10/14/22 138/64  09/09/22 (!) 146/82  04/16/22 (!) 142/76    Wt Readings from Last 3 Encounters:  10/14/22 204 lb (92.5 kg)  09/09/22 203 lb (92.1 kg)  04/16/22 200 lb (90.7 kg)    Physical Exam Vitals reviewed.  Constitutional:      Appearance: He is not ill-appearing.  HENT:  Mouth/Throat:     Mouth: Mucous membranes are moist.  Eyes:     General: No scleral icterus.    Conjunctiva/sclera: Conjunctivae normal.  Cardiovascular:     Rate and Rhythm: Normal rate and regular rhythm.     Heart sounds: No murmur heard.    No friction rub. No gallop.  Pulmonary:     Effort: Pulmonary effort is normal.     Breath sounds: No stridor. No wheezing, rhonchi or rales.  Abdominal:     General: Abdomen is flat.     Palpations: There is no mass.     Tenderness: There is no abdominal tenderness. There is no guarding.     Hernia: No hernia is  present.  Musculoskeletal:        General: Normal range of motion.     Cervical back: Neck supple.     Right lower leg: No edema.     Left lower leg: No edema.  Lymphadenopathy:     Cervical: No cervical adenopathy.  Skin:    General: Skin is warm and dry.     Findings: No rash.  Neurological:     General: No focal deficit present.     Mental Status: He is alert. Mental status is at baseline.  Psychiatric:        Mood and Affect: Mood normal.        Behavior: Behavior normal.     Lab Results  Component Value Date   WBC 5.0 09/19/2022   HGB 15.7 09/19/2022   HCT 46.9 09/19/2022   PLT 242.0 09/19/2022   GLUCOSE 77 09/19/2022   CHOL 180 09/19/2022   TRIG 141.0 09/19/2022   HDL 48.60 09/19/2022   LDLCALC 104 (H) 09/19/2022   ALT 26 04/16/2022   AST 27 04/16/2022   NA 143 09/19/2022   K 4.3 09/19/2022   CL 101 09/19/2022   CREATININE 1.14 09/19/2022   BUN 25 (H) 09/19/2022   CO2 33 (H) 09/19/2022   TSH 2.51 09/19/2022   PSA 1.48 09/19/2022    CT Abdomen Pelvis W Contrast  Result Date: 05/07/2021 CLINICAL DATA:  Unintended weight loss EXAM: CT ABDOMEN AND PELVIS WITH CONTRAST TECHNIQUE: Multidetector CT imaging of the abdomen and pelvis was performed using the standard protocol following bolus administration of intravenous contrast. RADIATION DOSE REDUCTION: This exam was performed according to the departmental dose-optimization program which includes automated exposure control, adjustment of the mA and/or kV according to patient size and/or use of iterative reconstruction technique. CONTRAST:  ISOVUE-300 IOPAMIDOL (ISOVUE-300) INJECTION 61% COMPARISON:  10/19/2015 FINDINGS: Lower chest: No acute abnormality.  Small hiatal hernia. Hepatobiliary: Numerous low-density lesions throughout the liver, the largest in the right hepatic lobe measuring 8.2 cm compared with 7 cm previously. These appear to reflect simple cysts. Small layering stones in the gallbladder. Pancreas: No  focal abnormality or ductal dilatation. Spleen: No focal abnormality.  Normal size. Adrenals/Urinary Tract: 1.8 cm low-density lesion in the midpole of the left kidney appears to reflect a simple cyst. 1.8 cm low-density lesion in the midpole of the right kidney also appears to reflect a simple cyst. No hydronephrosis. Adrenal glands and urinary bladder unremarkable. Stomach/Bowel: Sigmoid diverticulosis. No active diverticulitis. Stomach and small bowel decompressed, unremarkable. Duodenal diverticulum in the 2/3 portion of the duodenum measures 3.7 cm. Vascular/Lymphatic: Aortic atherosclerosis. No evidence of aneurysm or adenopathy. Reproductive: No visible focal abnormality.  Penile implant noted. Other: No free fluid or free air. Musculoskeletal: No acute bony abnormality. Degenerative changes  in the lumbar spine. Bilateral L5 pars defects noted. Subcutaneous soft tissue nodule along the skin surface in the left lateral abdominal wall measures 2.7 cm compared to 1.7 cm on prior study. This most likely reflects sebaceous cyst. IMPRESSION: Numerous low-density lesions throughout the liver measuring up to 8.2 cm. These appear to reflect simple cysts. Bilateral renal low-density lesions most compatible with cysts. Cholelithiasis. Sigmoid diverticulosis.  No active diverticulitis. Aortic atherosclerosis. No acute findings. Electronically Signed   By: Charlett Nose M.D.   On: 05/07/2021 23:22    Assessment & Plan:   Urticaria- Will increase the dose of doxepin. -     Ambulatory referral to Allergy -     Doxepin HCl; Take 2 capsules (20 mg total) by mouth at bedtime.  Dispense: 180 capsule; Refill: 0  Acquired hypothyroidism- He is euthyroid.  Chronic idiopathic constipation- He has not started Amitiza because of issues with the [place].  Primary hypertension- His blood pressure is well-controlled.     Follow-up: No follow-ups on file.  Sanda Linger, MD

## 2022-10-18 ENCOUNTER — Other Ambulatory Visit: Payer: Self-pay | Admitting: Internal Medicine

## 2022-10-18 DIAGNOSIS — L509 Urticaria, unspecified: Secondary | ICD-10-CM

## 2022-10-28 NOTE — Progress Notes (Unsigned)
10/29/2022 Daniel Marquez [id number] 29-Apr-1945  Referring provider: Etta Grandchild, MD Primary GI doctor: Dr. Myrtie Neither  ASSESSMENT AND PLAN:   History of adenomatous polyp of colon Remote colonoscopy, no hematochezia, weight loss or anemia We have discussed the risks of bleeding, infection, perforation, medication reactions, and remote risk of death associated with colonoscopy. All questions were answered and the patient acknowledges these risk and wishes to proceed.  Irritable bowel syndrome with both constipation and diarrhea Has been doing well on miralax/fiber Information given to the patient  Esophageal dysphagia EGD with dilatation to evaluate for stenosis, tumor, erosive/infectious esophagititis,. If the EGD is negative could consider barium swallow but could also be dysmotility from injectable sublocade I discussed risks of EGD with patient today, including risk of sedation, bleeding or perforation.  Patient provides understanding and gave verbal consent to proceed. Lifestyle changes discussed, avoid NSAIDS Weight loss discussed with the patient Declines trial of PPI, willing to do H2 like pepcid.  Will get right RUQ [place] but no hepatomegaly on exam, no pain with palpation  Liver cyst Seen on CT 2023, appears benign no hepatomegaly on exam, no pain with palpation  History of ETOH use and drug use Stopped in 1980's Continues on once monthly injectable sublocade  Patient Care Team: Etta Grandchild, MD as PCP - General (Internal Medicine)  HISTORY OF PRESENT ILLNESS: 77 y.o. male with a past medical history of PTSD, history of alcohol abuse in remission, opioid dependence following with pain management at the [facility], hypertension, hypothyroidism and others listed below presents for evaluation of colonoscopy and dysphagia.   05/07/2021 CT abdomen pelvis with contrast for unintended weight loss.  Numerous low-density lesions throughout liver measuring up to 8.2 cm  appears to reflect simple cyst, bilateral renal low-density lesions compatible with cyst, cholelithiasis, sigmoid diverticulosis without diverticulitis aortic atherosclerosis, normal pancreas small hiatal hernia 09/19/2022 Hgb 15.7, platelets 242, AST 27, ALT 26, alk phos 41  He states he had Colonoscopy in [facility] about 13 years ago, and he was due for 3 year follow up due to polyps but he states he did not know that until recently.  He has had cologuard at the [place], uncertain date.  No family history of colon cancer that he knows of, father with possible liver cancer/mets to liver.  He states he has a history of IBS-C, states his "brain is in his gut".  Can have associated fullness, nausea and feels he does not completely empty his bowel.  He has been started on miralax and benefiber combo with recent good results, having BM daily. Rare AB cramping.  Patient denies hematochezia.  Denis AB pain or bloating.  Denies changes in appetite, unintentional weight loss.  Patient reports GERD, daily if he is not mindful of what he eats. Worse with acidic foods, eating later than he should. He is only on tums.  Has had dysphagia for last year, has become more consistent. He states he is not a good chewer, eats too quickly and will have to drink water to help it move down. No issues with liquids, no painful swallowing.  He denies nausea but can have regurgitation of food if the water does not help. Patient denies melena.  No neck surgery, no neck radiation.   He denies blood thinner use.  He reports NSAID use, once every 3-7 days.  He is on sublocade 100 mg for pain, monthly injectable.  He denies ETOH use. Sober March 18th, 1976  He denies tobacco use, had 15 pack year smoking history, quit in 1980's.  He denies drug use.    He  reports that he has quit smoking. He has never been exposed to tobacco smoke. He has never used smokeless tobacco. He reports that he does not drink alcohol and does not  use drugs.  RELEVANT LABS AND IMAGING: CBC    Component Value Date/Time   WBC 5.0 09/19/2022 1121   RBC 4.98 09/19/2022 1121   HGB 15.7 09/19/2022 1121   HGB 16.7 05/22/2021 1340   HCT 46.9 09/19/2022 1121   PLT 242.0 09/19/2022 1121   PLT 210 05/22/2021 1340   MCV 94.1 09/19/2022 1121   MCH 30.1 05/22/2021 1340   MCHC 33.5 09/19/2022 1121   RDW 13.5 09/19/2022 1121   LYMPHSABS 1.2 09/19/2022 1121   MONOABS 0.6 09/19/2022 1121   EOSABS 0.1 09/19/2022 1121   BASOSABS 0.0 09/19/2022 1121   Recent Labs    04/16/22 0933 09/19/22 1121  HGB 15.2 15.7    CMP     Component Value Date/Time   NA 143 09/19/2022 1121   NA 140 04/25/2021 0000   K 4.3 09/19/2022 1121   CL 101 09/19/2022 1121   CO2 33 (H) 09/19/2022 1121   GLUCOSE 77 09/19/2022 1121   BUN 25 (H) 09/19/2022 1121   BUN 26 (A) 04/25/2021 0000   CREATININE 1.14 09/19/2022 1121   CALCIUM 10.0 09/19/2022 1121   PROT 7.0 04/16/2022 0933   ALBUMIN 4.3 04/16/2022 0933   AST 27 04/16/2022 0933   ALT 26 04/16/2022 0933   ALKPHOS 41 04/16/2022 0933   BILITOT 0.6 04/16/2022 0933      Latest Ref Rng & Units 04/16/2022    9:33 AM 04/25/2021   12:00 AM  Hepatic Function  Total Protein 6.0 - 8.3 g/dL 7.0    Albumin 3.5 - 5.2 g/dL 4.3    AST 0 - 37 U/L 27  22      ALT 0 - 53 U/L 26  37      Alk Phosphatase 39 - 117 U/L 41  54      Total Bilirubin 0.2 - 1.2 mg/dL 0.6    Bilirubin, Direct 0.0 - 0.3 mg/dL 0.1  0.6         This result is from an external source.      Current Medications:   Current Outpatient Medications (Endocrine & Metabolic):    levothyroxine (SYNTHROID, LEVOTHROID) 25 MCG tablet, Take 25 mcg by mouth daily before breakfast.   Testosterone 20.25 MG/1.25GM (1.62%) GEL, Apply 1 Act topically daily.  Current Outpatient Medications (Cardiovascular):    atenolol (TENORMIN) 25 MG tablet, Take 1 tablet (25 mg total) by mouth daily. (Patient taking differently: Take 50 mg by mouth daily.)    losartan-hydrochlorothiazide (HYZAAR) 100-25 MG tablet, Take 1 tablet by mouth daily.   Current Outpatient Medications (Analgesics):    Buprenorphine ER (SUBLOCADE) 100 MG/0.5ML SOSY, Inject 100 mg into the skin every 30 (thirty) days.   Current Outpatient Medications (Other):    doxepin (SINEQUAN) 10 MG capsule, Take 2 capsules (20 mg total) by mouth at bedtime.   Multiple Vitamin (MULTI-VITAMIN DAILY PO), Take by mouth.   Omega-3 Fatty Acids (FISH OIL) 1000 MG CAPS, Take 1,000 capsules by mouth.   ondansetron (ZOFRAN-ODT) 4 MG disintegrating tablet, Take 4 mg by mouth once.   Red Yeast Rice 55 MG CAPS, Take 2-3 capsules by mouth daily.   Resveratrol 100 MG CAPS, Take  by mouth.  Medical History:  Past Medical History:  Diagnosis Date   Hypertension    Thyroid disease    Allergies:  Allergies  Allergen Reactions   Codeine Other (See Comments)    Syncope     Surgical History:  He  has a past surgical history that includes Tonsillectomy; Cyst excision; Tendon repair; Shoulder arthroscopy; and Joint replacement. Family History:  His family history includes Cancer in his father; Diabetes in his father; Heart attack in his father; Hypertension in his father.  REVIEW OF SYSTEMS  : All other systems reviewed and negative except where noted in the History of Present Illness.  PHYSICAL EXAM: BP 138/82   Pulse 80   Ht 5\' 11"  (1.803 m)   Wt 206 lb 12.8 oz (93.8 kg)   BMI 28.84 kg/m  General Appearance: Well nourished, in no apparent distress. Head:   Normocephalic and atraumatic. Eyes:  sclerae anicteric,conjunctive pink  Respiratory: Respiratory effort normal, BS equal bilaterally without rales, rhonchi, wheezing. Cardio: RRR with no MRGs. Peripheral pulses intact.  Abdomen: Soft,  Obese ,active bowel sounds. No tenderness . Without guarding and Without rebound. No masses. Rectal: Not evaluated Musculoskeletal: Full ROM, Normal gait. Without edema, wearing compression  socks. Skin:  Dry and intact without significant lesions or rashes Neuro: Alert and  oriented x4;  No focal deficits. Psych:  Cooperative. Normal mood and affect.    Doree Albee, PA-C 9:04 AM

## 2022-10-29 ENCOUNTER — Encounter: Payer: Self-pay | Admitting: Physician Assistant

## 2022-10-29 ENCOUNTER — Ambulatory Visit (INDEPENDENT_AMBULATORY_CARE_PROVIDER_SITE_OTHER): Payer: Medicare PPO | Admitting: Physician Assistant

## 2022-10-29 VITALS — BP 138/82 | HR 80 | Ht 71.0 in | Wt 206.8 lb

## 2022-10-29 DIAGNOSIS — R1319 Other dysphagia: Secondary | ICD-10-CM

## 2022-10-29 DIAGNOSIS — K7689 Other specified diseases of liver: Secondary | ICD-10-CM | POA: Diagnosis not present

## 2022-10-29 DIAGNOSIS — Z86010 Personal history of colonic polyps: Secondary | ICD-10-CM

## 2022-10-29 DIAGNOSIS — K582 Mixed irritable bowel syndrome: Secondary | ICD-10-CM | POA: Diagnosis not present

## 2022-10-29 MED ORDER — NA SULFATE-K SULFATE-MG SULF 17.5-3.13-1.6 GM/177ML PO SOLN: 1.0000 | Freq: Once | ORAL | 0 refills | Status: AC

## 2022-10-29 NOTE — Patient Instructions (Addendum)
You have been scheduled for a colonoscopy/egd. Please follow written instructions given to you at your visit today.   Please pick up your prep supplies at the pharmacy within the next 1-3 days.  If you use inhalers (even only as needed), please bring them with you on the day of your procedure.  DO NOT TAKE 7 DAYS PRIOR TO TEST- Trulicity (dulaglutide) Ozempic, Wegovy (semaglutide) Mounjaro (tirzepatide) Bydureon Bcise (exanatide extended release)  DO NOT TAKE 1 DAY PRIOR TO YOUR TEST Rybelsus (semaglutide) Adlyxin (lixisenatide) Victoza (liraglutide) Byetta (exanatide) ___________________________________________________________________________   Dysphagia precautions:  1. Take reflux medications 30+ minutes before food in the morning, can do tagamet or pepcid 2. Begin meals with warm beverage 3. Eat smaller more frequent meals 4. Eat slowly, taking small bites and sips 5. Alternate solids and liquids 6. Avoid foods/liquids that increase acid production 7. Sit upright during and for 30+ minutes after meals to facilitate esophageal clearing 8. All meats should be chopped finely.   If something gets hung in your esophagus and will not come up or go down, proceed to the emergency room.    Miralax is an osmotic laxative.  It only brings more water into the stool.  This is safe to take daily.  Can take up to 17 gram of miralax twice a day.  Mix with juice or coffee.  Start 1 capful at night for 3-4 days and reassess your response in 3-4 days.  You can increase and decrease the dose based on your response.  Remember, it can take up to 3-4 days to take effect OR for the effects to wear off.   I often pair this with benefiber in the morning to help assure the stool is not too loose.   Toileting tips to help with your constipation - Drink at least 64-80 ounces of water/liquid per day. - Establish a time to try to move your bowels every day.  For many people, this is after a cup of  coffee or after a meal such as breakfast. - Sit all of the way back on the toilet keeping your back fairly straight and while sitting up, try to rest the tops of your forearms on your upper thighs.   - Raising your feet with a step stool/squatty potty can be helpful to improve the angle that allows your stool to pass through the rectum. - Relax the rectum feeling it bulge toward the toilet water.  If you feel your rectum raising toward your body, you are contracting rather than relaxing. - Breathe in and slowly exhale. "Belly breath" by expanding your belly towards your belly button. Keep belly expanded as you gently direct pressure down and back to the anus.  A low pitched GRRR sound can assist with increasing intra-abdominal pressure.  (Can also trying to blow on a pinwheel and make it move, this helps with the same belly breathing) - Repeat 3-4 times. If unsuccessful, contract the pelvic floor to restore normal tone and get off the toilet.  Avoid excessive straining. - To reduce excessive wiping by teaching your anus to normally contract, place hands on outer aspect of knees and resist knee movement outward.  Hold 5-10 second then place hands just inside of knees and resist inward movement of knees.  Hold 5 seconds.  Repeat a few times each way.  Go to the ER if unable to pass gas, severe AB pain, unable to hold down food, any shortness of breath of chest pain.   _______________________________________________________  If your blood pressure at your visit was 140/90 or greater, please contact your primary care physician to follow up on this.  _______________________________________________________  If you are age 77 or older, your body mass index should be between 23-30. Your Body mass index is 28.84 kg/m. If this is out of the aforementioned range listed, please consider follow up with your Primary Care Provider.  If you are age 67 or younger, your body mass index should be between 19-25.  Your Body mass index is 28.84 kg/m. If this is out of the aformentioned range listed, please consider follow up with your Primary Care Provider.   ________________________________________________________  The [facility] providers would like to encourage you to use [facility] to communicate with providers for non-urgent requests or questions.  Due to long hold times on the telephone, sending your provider a message by [facility] may be a faster and more efficient way to get a response.  Please allow 48 business hours for a response.  Please remember that this is for non-urgent requests.  _______________________________________________________ It was a pleasure to see you today!  Thank you for trusting me with your gastrointestinal care!

## 2022-10-30 NOTE — Progress Notes (Signed)
____________________________________________________________  Attending physician addendum:  Thank you for sending this case to me. I have reviewed the entire note and agree with the plan.  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  Amada Jupiter, MD  ____________________________________________________________

## 2022-11-06 ENCOUNTER — Encounter (INDEPENDENT_AMBULATORY_CARE_PROVIDER_SITE_OTHER): Payer: Self-pay

## 2022-12-09 ENCOUNTER — Ambulatory Visit: Payer: Non-veteran care | Admitting: Internal Medicine

## 2022-12-17 DIAGNOSIS — M1712 Unilateral primary osteoarthritis, left knee: Secondary | ICD-10-CM | POA: Diagnosis not present

## 2022-12-24 ENCOUNTER — Other Ambulatory Visit: Payer: Self-pay

## 2022-12-25 ENCOUNTER — Encounter: Payer: Self-pay | Admitting: Certified Registered Nurse Anesthetist

## 2022-12-26 ENCOUNTER — Encounter: Payer: Self-pay | Admitting: Gastroenterology

## 2022-12-26 ENCOUNTER — Ambulatory Visit (AMBULATORY_SURGERY_CENTER): Payer: No Typology Code available for payment source | Admitting: Gastroenterology

## 2022-12-26 VITALS — BP 176/86 | HR 66 | Temp 98.7°F | Resp 13 | Ht 71.0 in | Wt 206.0 lb

## 2022-12-26 DIAGNOSIS — K2970 Gastritis, unspecified, without bleeding: Secondary | ICD-10-CM | POA: Diagnosis not present

## 2022-12-26 DIAGNOSIS — B3781 Candidal esophagitis: Secondary | ICD-10-CM | POA: Diagnosis not present

## 2022-12-26 DIAGNOSIS — Z860100 Personal history of colon polyps, unspecified: Secondary | ICD-10-CM

## 2022-12-26 DIAGNOSIS — D120 Benign neoplasm of cecum: Secondary | ICD-10-CM

## 2022-12-26 DIAGNOSIS — D122 Benign neoplasm of ascending colon: Secondary | ICD-10-CM

## 2022-12-26 DIAGNOSIS — K2100 Gastro-esophageal reflux disease with esophagitis, without bleeding: Secondary | ICD-10-CM | POA: Diagnosis not present

## 2022-12-26 DIAGNOSIS — Z09 Encounter for follow-up examination after completed treatment for conditions other than malignant neoplasm: Secondary | ICD-10-CM

## 2022-12-26 DIAGNOSIS — Z860101 Personal history of adenomatous and serrated colon polyps: Secondary | ICD-10-CM

## 2022-12-26 DIAGNOSIS — K635 Polyp of colon: Secondary | ICD-10-CM | POA: Diagnosis not present

## 2022-12-26 DIAGNOSIS — R1319 Other dysphagia: Secondary | ICD-10-CM

## 2022-12-26 MED ORDER — SODIUM CHLORIDE 0.9 % IV SOLN: 500.0000 mL | Freq: Once | INTRAVENOUS | Status: DC

## 2022-12-26 MED ORDER — OMEPRAZOLE 20 MG PO CPDR: 20.0000 mg | DELAYED_RELEASE_CAPSULE | Freq: Every day | ORAL | 0 refills | Status: DC

## 2022-12-26 NOTE — Progress Notes (Signed)
Report given to PACU, vss 

## 2022-12-26 NOTE — Op Note (Signed)
[facility] Patient Name: Daniel Marquez Procedure Date: 12/26/2022 2:49 PM MRN: [id number] Endoscopist: Sherilyn Cooter L. Myrtie Neither , MD, [id number] Age: 77 Referring MD:  Date of Birth: [birth year] Gender: Male Account #: [id number] Procedure:                Upper GI endoscopy Indications:              Esophageal dysphagia (intermittent. reports some                            evening and overnight regurgitation) Medicines:                Monitored Anesthesia Care Procedure:                Pre-Anesthesia Assessment:                           - Prior to the procedure, a History and Physical                            was performed, and patient medications and                            allergies were reviewed. The patient's tolerance of                            previous anesthesia was also reviewed. The risks                            and benefits of the procedure and the sedation                            options and risks were discussed with the patient.                            All questions were answered, and informed consent                            was obtained. Prior Anticoagulants: The patient has                            taken no anticoagulant or antiplatelet agents. ASA                            Grade Assessment: II - A patient with mild systemic                            disease. After reviewing the risks and benefits,                            the patient was deemed in satisfactory condition to                            undergo the procedure.  After obtaining informed consent, the endoscope was                            passed under direct vision. Throughout the                            procedure, the patient's blood pressure, pulse, and                            oxygen saturations were monitored continuously. The                            GIF [id number] #[id number] was introduced through the                            mouth, and advanced  to the second part of duodenum.                            The upper GI endoscopy was accomplished without                            difficulty. The patient tolerated the procedure                            well. Scope In: Scope Out: Findings:                 The larynx was normal.                           LA Grade A (one or more mucosal breaks less than 5                            mm, not extending between tops of 2 mucosal folds)                            esophagitis was found at the gastroesophageal                            junction.                           Patchy mucosal changes characterized by scattered                            raised and small yellow plaques were found in the                            upper third of the esophagus and in the middle                            third of the esophagus. Biopsies were taken with a                            cold  forceps for histology. (Jar 5)                           A single diminutive mucosal papule (nodule) was                            found in the cardia. Biopsies were taken with a                            cold forceps for histology. (Jar 4)                           Patchy mild inflammation characterized by erosions                            and atrophy was found in the entire examined                            stomach. Biopsies were taken with a cold forceps                            for histology. (antru and body in Jar 3 to r/o H                            pylori)                           The exam of the stomach was otherwise normal.                           The cardia and gastric fundus were normal on                            retroflexion.                           The examined duodenum was normal. Complications:            No immediate complications. Estimated Blood Loss:     Estimated blood loss was minimal. Impression:               - Normal larynx.                           - LA Grade A reflux  esophagitis.                           - Scattered raised and small yellow plaques mucosa                            in the esophagus. Biopsied.                           - A single mucosal papule (nodule) found in the  stomach. Biopsied.                           - Gastritis. Biopsied.                           - Normal examined duodenum.                           No esophageal stricture seen. Dysphagia is likely a                            motility issue related to GERD and possibly long                            term use of therapeutic opioid. Recommendation:           - Patient has a contact number available for                            emergencies. The signs and symptoms of potential                            delayed complications were discussed with the                            patient. Return to normal activities tomorrow.                            Written discharge instructions were provided to the                            patient.                           - Resume previous diet.                           - Continue present medications.                           - Await pathology results.                           - Use Prilosec (omeprazole) 20 mg PO daily for 8                            weeks (disp #60, rf 0)                           - Follow an antireflux regimen indefinitely. Most                            importantly, avoid eating within several hours of                            laying down, and elevate head  of bed. Clarissa Laird L. Myrtie Neither, MD 12/26/2022 4:00:25 PM This report has been signed electronically.

## 2022-12-26 NOTE — Patient Instructions (Addendum)
Colonoscopy:  Handouts on polyps & diverticulosis given to you today.   Await pathology results on polyps removed   Resume previous diet & medications    Upper Endoscopy:  Handout on gastritis and esophagitis given to you today  Await esophageal & stomach biopsies   Follow anti reflux regimen - handout given to you  Avoid laying down until several hours after eating   Use Prilosec 20 mg daily for 8 weeks - order sent to your pharmacy     YOU HAD AN ENDOSCOPIC PROCEDURE TODAY AT THE [facility]:   Refer to the procedure report that was given to you for any specific questions about what was found during the examination.  If the procedure report does not answer your questions, please call your gastroenterologist to clarify.  If you requested that your care partner not be given the details of your procedure findings, then the procedure report has been included in a sealed envelope for you to review at your convenience later.  YOU SHOULD EXPECT: Some feelings of bloating in the abdomen. Passage of more gas than usual.  Walking can help get rid of the air that was put into your GI tract during the procedure and reduce the bloating. If you had a lower endoscopy (such as a colonoscopy or flexible sigmoidoscopy) you may notice spotting of blood in your stool or on the toilet paper. If you underwent a bowel prep for your procedure, you may not have a normal bowel movement for a few days.  Please Note:  You might notice some irritation and congestion in your nose or some drainage.  This is from the oxygen used during your procedure.  There is no need for concern and it should clear up in a day or so.  SYMPTOMS TO REPORT IMMEDIATELY:  Following lower endoscopy (colonoscopy or flexible sigmoidoscopy):  Excessive amounts of blood in the stool  Significant tenderness or worsening of abdominal pains  Swelling of the abdomen that is new, acute  Fever of 100F or higher  Following  upper endoscopy (EGD)  Vomiting of blood or coffee ground material  New chest pain or pain under the shoulder blades  Painful or persistently difficult swallowing  New shortness of breath  Fever of 100F or higher  Black, tarry-looking stools  For urgent or emergent issues, a gastroenterologist can be reached at any hour by calling [phone]. Do not use MyChart messaging for urgent concerns.    DIET:  We do recommend a small meal at first, but then you may proceed to your regular diet.  Drink plenty of fluids but you should avoid alcoholic beverages for 24 hours.  ACTIVITY:  You should plan to take it easy for the rest of today and you should NOT DRIVE or use heavy machinery until tomorrow (because of the sedation medicines used during the test).    FOLLOW UP: Our staff will call the number listed on your records the next business day following your procedure.  We will call around 7:15- 8:00 am to check on you and address any questions or concerns that you may have regarding the information given to you following your procedure. If we do not reach you, we will leave a message.     If any biopsies were taken you will be contacted by phone or by letter within the next 1-3 weeks.  Please call us at [phone] if you have not heard about the biopsies in 3 weeks.    SIGNATURES/CONFIDENTIALITY:  You and/or your care partner have signed paperwork which will be entered into your electronic medical record.  These signatures attest to the fact that that the information above on your After Visit Summary has been reviewed and is understood.  Full responsibility of the confidentiality of this discharge information lies with you and/or your care-partner.

## 2022-12-26 NOTE — Op Note (Signed)
[facility] Patient Name: Daniel Marquez Procedure Date: 12/26/2022 2:53 PM MRN: [id number] Endoscopist: Sherilyn Cooter L. Myrtie Neither , MD, [id number] Age: 77 Referring MD:  Date of Birth: 1946/01/10 Gender: Male Account #: [id number] Procedure:                Colonoscopy Indications:              Surveillance: Personal history of colonic polyps                            (unknown histology) on last colonoscopy more than 5                            years ago                           last colonoscopy > 10 yrs ago at outside clinic                            with reported polyps of unknown pathology Medicines:                Monitored Anesthesia Care Procedure:                Pre-Anesthesia Assessment:                           - Prior to the procedure, a History and Physical                            was performed, and patient medications and                            allergies were reviewed. The patient's tolerance of                            previous anesthesia was also reviewed. The risks                            and benefits of the procedure and the sedation                            options and risks were discussed with the patient.                            All questions were answered, and informed consent                            was obtained. Prior Anticoagulants: The patient has                            taken no anticoagulant or antiplatelet agents. ASA                            Grade Assessment: II - A patient with mild systemic  disease. After reviewing the risks and benefits,                            the patient was deemed in satisfactory condition to                            undergo the procedure.                           After obtaining informed consent, the colonoscope                            was passed under direct vision. Throughout the                            procedure, the patient's blood pressure, pulse, and                             oxygen saturations were monitored continuously. The                            Olympus Scope SN: [id number] was introduced through                            the anus and advanced to the the cecum, identified                            by appendiceal orifice and ileocecal valve. The                            colonoscopy was somewhat difficult due to a                            redundant colon and significant looping. Successful                            completion of the procedure was aided by using                            manual pressure and straightening and shortening                            the scope to obtain bowel loop reduction. The                            patient tolerated the procedure well. The quality                            of the bowel preparation was good. The ileocecal                            valve, appendiceal orifice, and rectum were  photographed. Scope In: 3:07:23 PM Scope Out: 3:25:59 PM Scope Withdrawal Time: 0 hours 15 minutes 3 seconds  Total Procedure Duration: 0 hours 18 minutes 36 seconds  Findings:                 The perianal and digital rectal examinations were                            normal.                           Repeat examination of right colon under NBI                            performed.                           A diffuse area of melanosis was found in the entire                            colon.                           A diminutive polyp was found in the cecum (AO). The                            polyp was flat. The polyp was removed with a cold                            biopsy forceps. Resection and retrieval were                            complete.                           Two semi-sessile polyps were found in the ascending                            colon. The polyps were 3 to 5 mm in size. These                            polyps were removed with a cold snare. Resection                             and retrieval were complete.                           A few diverticula were found in the left colon.                           The exam was otherwise without abnormality on                            direct and retroflexion views. Complications:            No immediate complications. Estimated Blood Loss:  Estimated blood loss was minimal. Impression:               - Melanosis in the colon.                           - One diminutive polyp in the cecum, removed with a                            cold biopsy forceps. Resected and retrieved.                           - Two 3 to 5 mm polyps in the ascending colon,                            removed with a cold snare. Resected and retrieved.                           - Diverticulosis in the left colon.                           - The examination was otherwise normal on direct                            and retroflexion views. Recommendation:           - Patient has a contact number available for                            emergencies. The signs and symptoms of potential                            delayed complications were discussed with the                            patient. Return to normal activities tomorrow.                            Written discharge instructions were provided to the                            patient.                           - Resume previous diet.                           - Continue present medications.                           - Await pathology results.                           - No repeat colonoscopy due to age, current                            guidelines and low risk findings today.  Claretha Townshend L. Myrtie Neither, MD 12/26/2022 3:49:39 PM This report has been signed electronically.

## 2022-12-26 NOTE — Progress Notes (Signed)
1502 Robinul 0.1 mg IV given due large amount of secretions upon assessment.  MD made aware, vss

## 2022-12-26 NOTE — Progress Notes (Signed)
History and Physical:  This patient presents for endoscopic testing for: Encounter Diagnoses  Name Primary?   History of adenomatous polyp of colon Yes   Esophageal dysphagia     Clinical details in office consult note dated 10/29/22 No significant changes were identified.  The patient continues to be an appropriate candidate for the planned procedure and anesthesia.  Hx colon polyps and dysphagia   Patient is otherwise without complaints or active issues today.   Past Medical History: Past Medical History:  Diagnosis Date   GERD (gastroesophageal reflux disease)    Hypertension    Substance abuse (HCC)    alcoholism sober since 1976   Thyroid disease      Past Surgical History: Past Surgical History:  Procedure Laterality Date   CYST EXCISION     JOINT REPLACEMENT     SHOULDER ARTHROSCOPY     TENDON REPAIR     TONSILLECTOMY      Allergies: Allergies  Allergen Reactions   Codeine Other (See Comments)    Syncope    Outpatient Meds: Current Outpatient Medications  Medication Sig Dispense Refill   atenolol (TENORMIN) 25 MG tablet Take 1 tablet (25 mg total) by mouth daily. (Patient taking differently: Take 50 mg by mouth daily.) 90 tablet 1   levothyroxine (SYNTHROID, LEVOTHROID) 25 MCG tablet Take 25 mcg by mouth daily before breakfast.     losartan-hydrochlorothiazide (HYZAAR) 100-25 MG tablet Take 1 tablet by mouth daily.     METAMUCIL FIBER PO Take by mouth.     Multiple Vitamin (MULTI-VITAMIN DAILY PO) Take by mouth.     Omega-3 Fatty Acids (FISH OIL) 1000 MG CAPS Take 1,000 capsules by mouth.     ondansetron (ZOFRAN-ODT) 4 MG disintegrating tablet Take 4 mg by mouth once.     Red Yeast Rice 55 MG CAPS Take 2-3 capsules by mouth daily.     Resveratrol 100 MG CAPS Take by mouth.     Testosterone 20.25 MG/1.25GM (1.62%) GEL Apply 1 Act topically daily. 112.5 g 1   Buprenorphine ER (SUBLOCADE) 100 MG/0.5ML SOSY Inject 100 mg into the skin every 30 (thirty) days.      Buprenorphine HCl-Naloxone HCl 2-0.5 MG FILM SMARTSIG:1 Strip(s) Sublingual Daily PRN     doxepin (SINEQUAN) 10 MG capsule Take 2 capsules (20 mg total) by mouth at bedtime. 180 capsule 0   tadalafil (CIALIS) 10 MG tablet Take by mouth.     Current Facility-Administered Medications  Medication Dose Route Frequency Provider Last Rate Last Admin   0.9 %  sodium chloride infusion  500 mL Intravenous Once Sherrilyn Rist, MD          ___________________________________________________________________ Objective   Exam:  BP 134/84   Pulse 82   Temp 98.7 F (37.1 C)   Ht 5\' 11"  (1.803 m)   Wt 206 lb (93.4 kg)   SpO2 95%   BMI 28.73 kg/m   CV: regular , S1/S2 Resp: clear to auscultation bilaterally, normal RR and effort noted GI: soft, no tenderness, with active bowel sounds.   Assessment: Encounter Diagnoses  Name Primary?   History of adenomatous polyp of colon Yes   Esophageal dysphagia      Plan: Colonoscopy EGD with possible dilation  The benefits and risks of the planned procedure were described in detail with the patient or (when appropriate) their health care proxy.  Risks were outlined as including, but not limited to, bleeding, infection, perforation, adverse medication reaction leading to cardiac or  pulmonary decompensation, pancreatitis (if ERCP).  The limitation of incomplete mucosal visualization was also discussed.  No guarantees or warranties were given.  The patient is appropriate for an endoscopic procedure in the ambulatory setting.   - Amada Jupiter, MD

## 2022-12-26 NOTE — Progress Notes (Signed)
Called to room to assist during endoscopic procedure.  Patient ID and intended procedure confirmed with present staff. Received instructions for my participation in the procedure from the performing physician.  

## 2022-12-29 ENCOUNTER — Telehealth: Payer: Self-pay | Admitting: *Deleted

## 2022-12-29 NOTE — Telephone Encounter (Signed)
  Follow up Call-     12/26/2022    1:55 PM  Call back number  Post procedure Call Back phone  # [phone]  Permission to leave phone message Yes     Patient questions:  Do you have a fever, pain , or abdominal swelling? No. Pain Score  0 *  Have you tolerated food without any problems? Yes.    Have you been able to return to your normal activities? Yes.    Do you have any questions about your discharge instructions: Diet   No. Medications  No. Follow up visit  No.  Do you have questions or concerns about your Care? No.  Actions: * If pain score is 4 or above: No action needed, pain <4.

## 2023-01-01 ENCOUNTER — Encounter: Payer: Self-pay | Admitting: Gastroenterology

## 2023-01-01 LAB — SURGICAL PATHOLOGY

## 2023-01-02 ENCOUNTER — Other Ambulatory Visit: Payer: Self-pay

## 2023-01-02 MED ORDER — FLUCONAZOLE 100 MG PO TABS: 100.0000 mg | ORAL_TABLET | Freq: Every day | ORAL | 0 refills | Status: AC

## 2023-01-15 ENCOUNTER — Other Ambulatory Visit: Payer: Self-pay | Admitting: Internal Medicine

## 2023-01-15 ENCOUNTER — Ambulatory Visit (INDEPENDENT_AMBULATORY_CARE_PROVIDER_SITE_OTHER): Payer: No Typology Code available for payment source | Admitting: Allergy & Immunology

## 2023-01-15 ENCOUNTER — Other Ambulatory Visit: Payer: Self-pay

## 2023-01-15 ENCOUNTER — Encounter: Payer: Self-pay | Admitting: Allergy & Immunology

## 2023-01-15 VITALS — BP 120/78 | HR 81 | Temp 98.0°F | Resp 16 | Ht 68.9 in | Wt 201.6 lb

## 2023-01-15 DIAGNOSIS — L509 Urticaria, unspecified: Secondary | ICD-10-CM

## 2023-01-15 DIAGNOSIS — R221 Localized swelling, mass and lump, neck: Secondary | ICD-10-CM

## 2023-01-15 DIAGNOSIS — R220 Localized swelling, mass and lump, head: Secondary | ICD-10-CM | POA: Diagnosis not present

## 2023-01-15 DIAGNOSIS — L299 Pruritus, unspecified: Secondary | ICD-10-CM

## 2023-01-15 DIAGNOSIS — L508 Other urticaria: Secondary | ICD-10-CM | POA: Diagnosis not present

## 2023-01-15 DIAGNOSIS — J300 Vasomotor rhinitis: Secondary | ICD-10-CM | POA: Diagnosis not present

## 2023-01-15 NOTE — Patient Instructions (Addendum)
1. Vasomotor rhinitis - Start ipratropium 1 spray per nostril up to 3 times daily. - This can be over drying. - Do not use Afrin for more than 3 days at a time.  2. Pruritus - We are going to get some labs to look for weird causes of itching. - Alpha-Gal Panel - ANA, IFA (with reflex) - Chronic Urticaria - C-reactive protein - CMP14+EGFR - Sedimentation rate - Tryptase - Thyroid antibodies - CBC with Differential/Platelet - If there is ever a rash, take a picture of it for the next visit. - You can try taking double antihistamine doses when it happens to see if this helps at all or to even prevent them from happening (Allegra two tablets up to twice daily or Zyrtec 2 tablets up to twice daily) - I do not find Claritin to be very effective.  3. Swelling of lip, tongue, and throat to shellfish - We are going to get a shellfish panel to look for shellfish allergy. - We can refill your EpiPen if you are interested. - We we will contact you in 1 to 2 weeks for the results of the testing.  4. Return in about 3 months (around 04/17/2023). You can have the follow up appointment with Dr. Dellis Anes or a Nurse Practicioner (our Nurse Practitioners are excellent and always have Physician oversight!).    Please inform us of any Emergency Department visits, hospitalizations, or changes in symptoms. Call us before going to the ED for breathing or allergy symptoms since we might be able to fit you in for a sick visit. Feel free to contact us anytime with any questions, problems, or concerns.  It was a pleasure to meet you today!  Websites that have reliable patient information: 1. American Academy of Asthma, Allergy, and Immunology: www.aaaai.org 2. Food Allergy Research and Education (FARE): foodallergy.org 3. Mothers of Asthmatics: http://www.asthmacommunitynetwork.org 4. American College of Allergy, Asthma, and Immunology: www.acaai.org   COVID-19 Vaccine Information can be found at:  [email or web address].nl For questions related to vaccine distribution or appointments, please email vaccine@[email or web address] or call [phone].     "Like" [place] on Facebook and Instagram for our latest updates!      A healthy democracy works best when [employer] participate! Make sure you are registered to vote! If you have moved or changed any of your contact information, you will need to get this updated before voting! Scan the QR codes below to learn more!

## 2023-01-15 NOTE — Progress Notes (Signed)
NEW PATIENT  Date of Service/Encounter:  01/15/23  Consult requested by: Dennard Schaumann, MD   Assessment:   Vasomotor rhinitis  Pruritus with occasional urticaria - possibly related to the initiation of Sublocade  Concern for food allergies (shellfish)   Plan/Recommendations:   1. Vasomotor rhinitis - Start ipratropium 1 spray per nostril up to 3 times daily. - This can be over drying. - Do not use Afrin for more than 3 days at a time.  2. Pruritus - We are going to get some labs to look for weird causes of itching. - Alpha-Gal Panel - ANA, IFA (with reflex) - Chronic Urticaria - C-reactive protein - CMP14+EGFR - Sedimentation rate - Tryptase - Thyroid antibodies - CBC with Differential/Platelet - If there is ever a rash, take a picture of it for the next visit. - You can try taking double antihistamine doses when it happens to see if this helps at all or to even prevent them from happening (Allegra two tablets up to twice daily or Zyrtec 2 tablets up to twice daily) - I do not find Claritin to be very effective.  3. Swelling of lip, tongue, and throat to shellfish - We are going to get a shellfish panel to look for shellfish allergy. - We can refill your EpiPen if you are interested. - We we will contact you in 1 to 2 weeks for the results of the testing.  4. Return in about 3 months (around 04/17/2023). You can have the follow up appointment with Dr. Dellis Anes or a Nurse Practicioner (our Nurse Practitioners are excellent and always have Physician oversight!).   This note in its entirety was forwarded to the Provider who requested this consultation.  Subjective:   Daniel Marquez is a 77 y.o. male presenting today for evaluation of  Chief Complaint  Patient presents with   Urticaria    He states that he isn't sure what it is, but it's a hot spot that is very itchy and is keeping him up at night. Most itching creams don't help, but he says very hot water  helps relieve it. He states it's been going around for 6 months to possibly a year. He received an injection in January (not sure what it was) and states that it helped.    Daniel Marquez has a history of the following: Patient Active Problem List   Diagnosis Date Noted   Urticaria 10/14/2022   Dyslipidemia, goal LDL below 100 09/20/2022   Chronic idiopathic constipation 09/20/2022   Urticaria geographica 04/16/2022   Encounter for general adult medical examination with abnormal findings 04/16/2022   Acquired hypothyroidism 08/21/2021   Stage 3a chronic kidney disease (HCC) 08/21/2021   Hypogonadism male 07/29/2021   Irritable bowel syndrome with both constipation and diarrhea 06/04/2021   Hypertension    Osteoarthritis of left knee 11/20/2020    History obtained from: chart review and patient.  Discussed the use of AI scribe software for clinical note transcription with the patient and/or guardian, who gave verbal consent to proceed.  Daniel Marquez was referred by Dennard Schaumann, MD.     Daniel Marquez is a 77 y.o. male presenting for an evaluation of itching and rash .  The patient, with a history of substance abuse and currently on buprenorphine monthly injections, presents with intermittent nocturnal pruritus. The itching, described as 'hot spots,' is localized to various areas, predominantly the legs, but also the hands and occasionally the chest. The patient denies any visible rash or  other systemic symptoms such as throat swelling, wheezing, or abdominal pain. The pruritus is not associated with food intake, although the patient has a long-standing history of occasional throat tingling with shrimp consumption.  The patient noticed the pruritus a couple of months ago, around the same time he started receiving buprenorphine injections. The injection site itself becomes itchy, but the patient also experiences itching in other areas. The patient has tried various antihistamines,  including Zyrtec and Allegra, with limited relief. A trial of prednisone provided some relief but did not completely resolve the symptoms.  The patient has a history of what he believes to be vasomotor rhinitis, managed with as-needed oxymetazoline and occasional Allegra. He denies any recent antibiotic use or new medications, and has not previously undergone allergy testing. 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     Otherwise, there is no history of other atopic diseases, including drug allergies, stinging insect allergies, or contact dermatitis. There is no significant infectious history. Vaccinations are up to date.    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 If you have any questions, ask your nurse or doctor.          atenolol 25 MG tablet Commonly known as: TENORMIN Take 1 tablet (25 mg total) by mouth daily. What changed: how much to take   BENEFIBER PO Take 10 g/mL by mouth as needed (daily as needed). Patient states that he takes 2 tsps of benefiber as needed.   Buprenorphine HCl-Naloxone HCl 2-0.5 MG Film SMARTSIG:1 Strip(s) Sublingual Daily PRN   cimetidine 200 MG  tablet Commonly known as: TAGAMET Take 200 mg by mouth as needed.   doxepin 10 MG capsule Commonly known as: SINEQUAN TAKE TWO CAPSULES BY MOUTH AT BEDTIME   finasteride 5 MG tablet Commonly known as: PROSCAR Take 5 mg by mouth as needed.   Fish Oil 1000 MG Caps Take 1,000 capsules by mouth.   fluconazole 100 MG tablet Commonly known as: Diflucan Take 1 tablet (100 mg total) by mouth daily for 14 days.   hydrocortisone cream 1 % Apply 1 Application topically as needed for itching.   levothyroxine 25 MCG tablet Commonly known as: SYNTHROID Take 25 mcg by mouth daily before breakfast.   lidocaine-prilocaine cream Commonly known as: EMLA Apply 1 Application topically as needed.   losartan-hydrochlorothiazide 100-25 MG tablet Commonly known as: HYZAAR Take 1 tablet by mouth daily.   METAMUCIL FIBER PO Take by mouth.   MIRALAX PO Take 1 capsule by mouth as needed (as directed). Patient states he takes one capful of Miralax as needed.   MULTI-VITAMIN DAILY PO Take by  mouth.   omeprazole 20 MG capsule Commonly known as: PRILOSEC Take 1 capsule (20 mg total) by mouth daily.   ondansetron 4 MG disintegrating tablet Commonly known as: ZOFRAN-ODT Take 4 mg by mouth once.   Red Yeast Rice 55 MG Caps Take 2-3 capsules by mouth daily.   Resveratrol 100 MG Caps Take by mouth.   Sublocade 100 MG/0.5ML Sosy Generic drug: Buprenorphine ER Inject 100 mg into the skin every 30 (thirty) days.   tadalafil 10 MG tablet Commonly known as: CIALIS Take by mouth.   Testosterone 20.25 MG/1.25GM (1.62%) Gel Apply 1 Act topically daily.        Birth History: non-contributory  Developmental History: non-contributory  Past Surgical History: Past Surgical History:  Procedure Laterality Date   ADENOIDECTOMY     CYST EXCISION     JOINT REPLACEMENT     KNEE ARTHROSCOPY  1990   1992 and 1995   phlegmon  2004   SHOULDER ARTHROSCOPY     TENDON REPAIR     TONSILLECTOMY        Family History: Family History  Problem Relation Age of Onset   Cancer Father    Diabetes Father    Hypertension Father    Heart attack Father    Urticaria Sister    Angioedema Sister    Colon cancer Neg Hx    Colon polyps Neg Hx    Esophageal cancer Neg Hx    Rectal cancer Neg Hx    Stomach cancer Neg Hx      Social History: Karry lives at home with his fiance.  He lives in a house that is built in the 100s.  There is wood and area rugs throughout the home.  They have gas heating and central cooling.  There is a poodle inside of the home as well as lots of outdoor animals outside of the home.  There are dust mite covers on the bed, but not the pillows.  There is tobacco exposure.  He worked as a [employer] worker.  There they do not live near an interstate or industrial area.  He quit smoking in 1983 and started smoking in 1967.   Review of systems otherwise negative other than that mentioned in the HPI.    Objective:   Blood pressure 120/78, pulse 81, temperature 98 F (36.7 C), temperature source Temporal, resp. rate 16, height 5' 8.9" (1.75 m), weight 201 lb 9.6 oz (91.4 kg), SpO2 92%. Body mass index is 29.86 kg/m.     Physical Exam Vitals reviewed.  Constitutional:      Appearance: He is well-developed.     Comments: Pleasant. Cooperative with the exam.   HENT:     Head: Normocephalic and atraumatic.     Right Ear: Tympanic membrane, ear canal and external ear normal. No drainage, swelling or tenderness. Tympanic membrane is not injected, scarred, erythematous, retracted or bulging.     Left Ear: Tympanic membrane, ear canal and external ear normal. No drainage, swelling or tenderness. Tympanic membrane is not injected, scarred, erythematous, retracted or bulging.     Nose: No nasal deformity, septal deviation, mucosal edema or rhinorrhea.     Right Sinus: No maxillary sinus tenderness or frontal sinus tenderness.     Left Sinus: No maxillary sinus  tenderness or frontal sinus tenderness.     Mouth/Throat:     Mouth: Mucous membranes are not pale and not dry.     Pharynx: Uvula midline.  Eyes:  General: Lids are normal.        Right eye: No discharge.        Left eye: No discharge.     Conjunctiva/sclera: Conjunctivae normal.     Right eye: Right conjunctiva is not injected. No chemosis.    Left eye: Left conjunctiva is not injected. No chemosis.    Pupils: Pupils are equal, round, and reactive to light.  Cardiovascular:     Rate and Rhythm: Normal rate and regular rhythm.     Heart sounds: Normal heart sounds.  Pulmonary:     Effort: Pulmonary effort is normal. No tachypnea, accessory muscle usage or respiratory distress.     Breath sounds: Normal breath sounds. No wheezing, rhonchi or rales.  Chest:     Chest wall: No tenderness.  Abdominal:     Tenderness: There is no abdominal tenderness. There is no guarding or rebound.  Lymphadenopathy:     Head:     Right side of head: No submandibular, tonsillar or occipital adenopathy.     Left side of head: No submandibular, tonsillar or occipital adenopathy.     Cervical: No cervical adenopathy.  Skin:    Coloration: Skin is not pale.     Findings: No abrasion, erythema, petechiae or rash. Rash is not papular, urticarial or vesicular.  Neurological:     Mental Status: He is alert.  Psychiatric:        Behavior: Behavior is cooperative.      Diagnostic studies: labs sent instead         Malachi Bonds, MD [facility]

## 2023-01-20 ENCOUNTER — Encounter: Payer: Self-pay | Admitting: Allergy & Immunology

## 2023-01-20 ENCOUNTER — Ambulatory Visit: Payer: Non-veteran care | Admitting: Internal Medicine

## 2023-01-20 MED ORDER — IPRATROPIUM BROMIDE 0.03 % NA SOLN: NASAL | 3 refills | Status: AC

## 2023-01-20 MED ORDER — FEXOFENADINE HCL 180 MG PO TABS: 180.0000 mg | ORAL_TABLET | Freq: Every day | ORAL | 3 refills | Status: AC

## 2023-01-20 MED ORDER — EPINEPHRINE 0.3 MG/0.3ML IJ SOSY: PREFILLED_SYRINGE | INTRAMUSCULAR | 1 refills | Status: AC

## 2023-01-20 NOTE — Addendum Note (Signed)
Addended by: Areta Haber B on: 01/20/2023 06:29 PM   Modules accepted: Orders

## 2023-01-23 LAB — CMP14+EGFR
ALT: 26 [IU]/L (ref 0–44)
AST: 21 [IU]/L (ref 0–40)
Albumin: 4.4 g/dL (ref 3.8–4.8)
Alkaline Phosphatase: 53 [IU]/L (ref 44–121)
BUN/Creatinine Ratio: 22 (ref 10–24)
BUN: 28 mg/dL — ABNORMAL HIGH (ref 8–27)
Bilirubin Total: 0.4 mg/dL (ref 0.0–1.2)
CO2: 25 mmol/L (ref 20–29)
Calcium: 9.4 mg/dL (ref 8.6–10.2)
Chloride: 103 mmol/L (ref 96–106)
Creatinine, Ser: 1.25 mg/dL (ref 0.76–1.27)
Globulin, Total: 2.3 g/dL (ref 1.5–4.5)
Glucose: 94 mg/dL (ref 70–99)
Potassium: 4.1 mmol/L (ref 3.5–5.2)
Sodium: 142 mmol/L (ref 134–144)
Total Protein: 6.7 g/dL (ref 6.0–8.5)
eGFR: 60 mL/min/{1.73_m2} (ref >59)

## 2023-01-23 LAB — CBC WITH DIFFERENTIAL/PLATELET
Basophils Absolute: 0 10*3/uL (ref 0.0–0.2)
Basos: 1 %
EOS (ABSOLUTE): 0.2 10*3/uL (ref 0.0–0.4)
Eos: 4 %
Hematocrit: 52.5 % — ABNORMAL HIGH (ref 37.5–51.0)
Hemoglobin: 17.1 g/dL (ref 13.0–17.7)
Immature Grans (Abs): 0 10*3/uL (ref 0.0–0.1)
Immature Granulocytes: 0 %
Lymphocytes Absolute: 1.3 10*3/uL (ref 0.7–3.1)
Lymphs: 26 %
MCH: 31.1 pg (ref 26.6–33.0)
MCHC: 32.6 g/dL (ref 31.5–35.7)
MCV: 96 fL (ref 79–97)
Monocytes Absolute: 0.6 10*3/uL (ref 0.1–0.9)
Monocytes: 13 %
Neutrophils Absolute: 2.7 10*3/uL (ref 1.4–7.0)
Neutrophils: 56 %
Platelets: 214 10*3/uL (ref 150–450)
RBC: 5.49 x10E6/uL (ref 4.14–5.80)
RDW: 12.8 % (ref 11.6–15.4)
WBC: 4.9 10*3/uL (ref 3.4–10.8)

## 2023-01-23 LAB — ALLERGEN PROFILE, SHELLFISH
Clam IgE: <0.1 kU/L
F023-IgE Crab: 0.16 kU/L — AB
F080-IgE Lobster: <0.1 kU/L
F290-IgE Oyster: <0.1 kU/L
Scallop IgE: <0.1 kU/L
Shrimp IgE: 0.24 kU/L — AB

## 2023-01-23 LAB — ALPHA-GAL PANEL
Allergen Lamb IgE: 0.34 kU/L — AB
Beef IgE: 0.36 kU/L — AB
IgE (Immunoglobulin E), Serum: 188 [IU]/mL (ref 6–495)
O215-IgE Alpha-Gal: 0.62 kU/L — AB
Pork IgE: 0.16 kU/L — AB

## 2023-01-23 LAB — CHRONIC URTICARIA: cu index: 1.5 (ref <10)

## 2023-01-23 LAB — ANTINUCLEAR ANTIBODIES, IFA: ANA Titer 1: NEGATIVE

## 2023-01-23 LAB — SEDIMENTATION RATE: Sed Rate: 6 mm/h (ref 0–30)

## 2023-01-23 LAB — C-REACTIVE PROTEIN: CRP: <1 mg/L (ref 0–10)

## 2023-01-23 LAB — THYROID ANTIBODIES: Thyroperoxidase Ab SerPl-aCnc: 17 [IU]/mL (ref 0–34)

## 2023-01-23 LAB — TRYPTASE: Tryptase: 17.4 ug/L — ABNORMAL HIGH (ref 2.2–13.2)

## 2023-01-23 LAB — THYROID ANTIBODIES (THYROPEROXIDASE & THYROGLOBULIN): Thyroglobulin Antibody: <1 [IU]/mL (ref 0.0–0.9)

## 2023-02-05 ENCOUNTER — Telehealth: Payer: Self-pay | Admitting: Allergy & Immunology

## 2023-02-05 NOTE — Telephone Encounter (Signed)
I called and left a voicemail for the patient to give our office a call back to get scheduled for a follow up visit.

## 2023-04-16 ENCOUNTER — Encounter: Payer: Self-pay | Admitting: Internal Medicine

## 2023-04-16 ENCOUNTER — Telehealth: Payer: Self-pay

## 2023-04-16 ENCOUNTER — Ambulatory Visit: Payer: Medicare HMO | Admitting: Internal Medicine

## 2023-04-16 VITALS — BP 144/78 | HR 67 | Temp 97.5°F | Ht 68.9 in | Wt 205.8 lb

## 2023-04-16 DIAGNOSIS — I10 Essential (primary) hypertension: Secondary | ICD-10-CM

## 2023-04-16 DIAGNOSIS — K5904 Chronic idiopathic constipation: Secondary | ICD-10-CM | POA: Diagnosis not present

## 2023-04-16 DIAGNOSIS — N1832 Chronic kidney disease, stage 3b: Secondary | ICD-10-CM

## 2023-04-16 DIAGNOSIS — E785 Hyperlipidemia, unspecified: Secondary | ICD-10-CM

## 2023-04-16 DIAGNOSIS — E291 Testicular hypofunction: Secondary | ICD-10-CM

## 2023-04-16 DIAGNOSIS — N400 Enlarged prostate without lower urinary tract symptoms: Secondary | ICD-10-CM | POA: Diagnosis not present

## 2023-04-16 DIAGNOSIS — Z1159 Encounter for screening for other viral diseases: Secondary | ICD-10-CM | POA: Insufficient documentation

## 2023-04-16 LAB — BASIC METABOLIC PANEL
BUN: 31 mg/dL — ABNORMAL HIGH (ref 6–23)
CO2: 36 meq/L — ABNORMAL HIGH (ref 19–32)
Calcium: 10 mg/dL (ref 8.4–10.5)
Chloride: 99 meq/L (ref 96–112)
Creatinine, Ser: 1.68 mg/dL — ABNORMAL HIGH (ref 0.40–1.50)
GFR: 39.08 mL/min — ABNORMAL LOW (ref >60.00)
Glucose, Bld: 80 mg/dL (ref 70–99)
Potassium: 4.2 meq/L (ref 3.5–5.1)
Sodium: 143 meq/L (ref 135–145)

## 2023-04-16 LAB — CBC WITH DIFFERENTIAL/PLATELET
Basophils Absolute: 0 10*3/uL (ref 0.0–0.1)
Basophils Relative: 1 % (ref 0.0–3.0)
Eosinophils Absolute: 0.1 10*3/uL (ref 0.0–0.7)
Eosinophils Relative: 3 % (ref 0.0–5.0)
HCT: 54.4 % — ABNORMAL HIGH (ref 39.0–52.0)
Hemoglobin: 18.1 g/dL (ref 13.0–17.0)
Lymphocytes Relative: 24.8 % (ref 12.0–46.0)
Lymphs Abs: 1 10*3/uL (ref 0.7–4.0)
MCHC: 33.2 g/dL (ref 30.0–36.0)
MCV: 96.8 fL (ref 78.0–100.0)
Monocytes Absolute: 0.5 10*3/uL (ref 0.1–1.0)
Monocytes Relative: 13 % — ABNORMAL HIGH (ref 3.0–12.0)
Neutro Abs: 2.4 10*3/uL (ref 1.4–7.7)
Neutrophils Relative %: 58.2 % (ref 43.0–77.0)
Platelets: 201 10*3/uL (ref 150.0–400.0)
RBC: 5.62 Mil/uL (ref 4.22–5.81)
RDW: 14.3 % (ref 11.5–15.5)
WBC: 4.2 10*3/uL (ref 4.0–10.5)

## 2023-04-16 LAB — TSH: TSH: 2.1 u[IU]/mL (ref 0.35–5.50)

## 2023-04-16 LAB — PSA: PSA: 1.65 ng/mL (ref 0.10–4.00)

## 2023-04-16 LAB — MAGNESIUM: Magnesium: 2.3 mg/dL (ref 1.5–2.5)

## 2023-04-16 LAB — CK: Total CK: 232 U/L (ref 7–232)

## 2023-04-16 NOTE — Patient Instructions (Signed)
Hypertension, Adult High blood pressure (hypertension) is when the force of blood pumping through the arteries is too strong. The arteries are the blood vessels that carry blood from the heart throughout the body. Hypertension forces the heart to work harder to pump blood and may cause arteries to become narrow or stiff. Untreated or uncontrolled hypertension can lead to a heart attack, heart failure, a stroke, kidney disease, and other problems. A blood pressure reading consists of a higher number over a lower number. Ideally, your blood pressure should be below 120/80. The first ("top") number is called the systolic pressure. It is a measure of the pressure in your arteries as your heart beats. The second ("bottom") number is called the diastolic pressure. It is a measure of the pressure in your arteries as the heart relaxes. What are the causes? The exact cause of this condition is not known. There are some conditions that result in high blood pressure. What increases the risk? Certain factors may make you more likely to develop high blood pressure. Some of these risk factors are under your control, including: Smoking. Not getting enough exercise or physical activity. Being overweight. Having too much fat, sugar, calories, or salt (sodium) in your diet. Drinking too much alcohol. Other risk factors include: Having a personal history of heart disease, diabetes, high cholesterol, or kidney disease. Stress. Having a family history of high blood pressure and high cholesterol. Having obstructive sleep apnea. Age. The risk increases with age. What are the signs or symptoms? High blood pressure may not cause symptoms. Very high blood pressure (hypertensive crisis) may cause: Headache. Fast or irregular heartbeats (palpitations). Shortness of breath. Nosebleed. Nausea and vomiting. Vision changes. Severe chest pain, dizziness, and seizures. How is this diagnosed? This condition is diagnosed by  measuring your blood pressure while you are seated, with your arm resting on a flat surface, your legs uncrossed, and your feet flat on the floor. The cuff of the blood pressure monitor will be placed directly against the skin of your upper arm at the level of your heart. Blood pressure should be measured at least twice using the same arm. Certain conditions can cause a difference in blood pressure between your right and left arms. If you have a high blood pressure reading during one visit or you have normal blood pressure with other risk factors, you may be asked to: Return on a different day to have your blood pressure checked again. Monitor your blood pressure at home for 1 week or longer. If you are diagnosed with hypertension, you may have other blood or imaging tests to help your health care provider understand your overall risk for other conditions. How is this treated? This condition is treated by making healthy lifestyle changes, such as eating healthy foods, exercising more, and reducing your alcohol intake. You may be referred for counseling on a healthy diet and physical activity. Your health care provider may prescribe medicine if lifestyle changes are not enough to get your blood pressure under control and if: Your systolic blood pressure is above 130. Your diastolic blood pressure is above 80. Your personal target blood pressure may vary depending on your medical conditions, your age, and other factors. Follow these instructions at home: Eating and drinking  Eat a diet that is high in fiber and potassium, and low in sodium, added sugar, and fat. An example of this eating plan is called the DASH diet. DASH stands for Dietary Approaches to Stop Hypertension. To eat this way: Eat   plenty of fresh fruits and vegetables. Try to fill one half of your plate at each meal with fruits and vegetables. Eat whole grains, such as whole-wheat pasta, brown rice, or whole-grain bread. Fill about one  fourth of your plate with whole grains. Eat or drink low-fat dairy products, such as skim milk or low-fat yogurt. Avoid fatty cuts of meat, processed or cured meats, and poultry with skin. Fill about one fourth of your plate with lean proteins, such as fish, chicken without skin, beans, eggs, or tofu. Avoid pre-made and processed foods. These tend to be higher in sodium, added sugar, and fat. Reduce your daily sodium intake. Many people with hypertension should eat less than 1,500 mg of sodium a day. Do not drink alcohol if: Your health care provider tells you not to drink. You are pregnant, may be pregnant, or are planning to become pregnant. If you drink alcohol: Limit how much you have to: 0-1 drink a day for women. 0-2 drinks a day for men. Know how much alcohol is in your drink. In the [place] one drink equals one 12 oz bottle of beer (355 mL), one 5 oz glass of wine (148 mL), or one 1 oz glass of hard liquor (44 mL). Lifestyle  Work with your health care provider to maintain a healthy body weight or to lose weight. Ask what an ideal weight is for you. Get at least 30 minutes of exercise that causes your heart to beat faster (aerobic exercise) most days of the week. Activities may include walking, swimming, or biking. Include exercise to strengthen your muscles (resistance exercise), such as Pilates or lifting weights, as part of your weekly exercise routine. Try to do these types of exercises for 30 minutes at least 3 days a week. Do not use any products that contain nicotine or tobacco. These products include cigarettes, chewing tobacco, and vaping devices, such as e-cigarettes. If you need help quitting, ask your health care provider. Monitor your blood pressure at home as told by your health care provider. Keep all follow-up visits. This is important. Medicines Take over-the-counter and prescription medicines only as told by your health care provider. Follow directions carefully. Blood  pressure medicines must be taken as prescribed. Do not skip doses of blood pressure medicine. Doing this puts you at risk for problems and can make the medicine less effective. Ask your health care provider about side effects or reactions to medicines that you should watch for. Contact a health care provider if you: Think you are having a reaction to a medicine you are taking. Have headaches that keep coming back (recurring). Feel dizzy. Have swelling in your ankles. Have trouble with your vision. Get help right away if you: Develop a severe headache or confusion. Have unusual weakness or numbness. Feel faint. Have severe pain in your chest or abdomen. Vomit repeatedly. Have trouble breathing. These symptoms may be an emergency. Get help right away. Call 911. Do not wait to see if the symptoms will go away. Do not drive yourself to the hospital. Summary Hypertension is when the force of blood pumping through your arteries is too strong. If this condition is not controlled, it may put you at risk for serious complications. Your personal target blood pressure may vary depending on your medical conditions, your age, and other factors. For most people, a normal blood pressure is less than 120/80. Hypertension is treated with lifestyle changes, medicines, or a combination of both. Lifestyle changes include losing weight, eating a healthy,   low-sodium diet, exercising more, and limiting alcohol. This information is not intended to replace advice given to you by your health care provider. Make sure you discuss any questions you have with your health care provider. Document Revised: 01/15/2021 Document Reviewed: 01/15/2021 Elsevier Patient Education  2024 [employer]  

## 2023-04-16 NOTE — Progress Notes (Signed)
Subjective:  Patient ID: Daniel Marquez, male    DOB: [birth year]  Age: 78 y.o. MRN: [id number]  CC: Osteoarthritis, Gastroesophageal Reflux, Hypothyroidism, Hyperlipidemia, and Hypertension   HPI Daniel Marquez presents for f/up -----  Discussed the use of AI scribe software for clinical note transcription with the patient, who gave verbal consent to proceed.  History of Present Illness   The patient, with a history of hypertension, reflux, and constipation, reports overall good health with no recent episodes of chest pain, shortness of breath, or dizziness. He has been active, regularly attending the gym three times a week. He has received his flu shot and COVID booster.  He has been seen at the [facility] in the last few months and underwent a colonoscopy, which was unremarkable. He developed a rash, which was later attributed to bed bugs. The rash has since improved after extermination of the bed bugs and a visit to the allergist. He has stopped taking doxepin as the itching has resolved. He occasionally takes cimetidine for reflux, which is still a problem, but denies any trouble or painful swallowing.  The patient also reports constipation, managed with Miralax and Benefiber. He denies any symptoms of low testosterone such as fatigue or weight gain. He has been taking blood pressure medications and testosterone, which he requests to be prescribed due to cost considerations.  He reports knee pain, managed with Tylenol, Motrin, and buprenorphine. The pain is significant enough to wake him up at night, but he is not considering replacement surgery at this time. He also mentions that he takes a variety of "anti-aging" supplements.       Outpatient Medications Prior to Visit  Medication Sig Dispense Refill   cimetidine (TAGAMET) 200 MG tablet Take 200 mg by mouth as needed.     EPINEPHrine 0.3 MG/0.3ML SOSY Inject 0.3 mg into the muscle as needed for anaphylaxis. 1 each 1   fexofenadine  (ALLEGRA) 180 MG tablet Take 1 tablet (180 mg total) by mouth daily. 30 tablet 3   hydrocortisone cream 1 % Apply 1 Application topically as needed for itching.     ipratropium (ATROVENT) 0.03 % nasal spray 1 spray each nostril up to 3 Times daily. 30 mL 3   losartan-hydrochlorothiazide (HYZAAR) 100-25 MG tablet Take 1 tablet by mouth daily.     Multiple Vitamin (MULTI-VITAMIN DAILY PO) Take by mouth.     Omega-3 Fatty Acids (FISH OIL) 1000 MG CAPS Take 1,000 capsules by mouth.     Polyethylene Glycol 3350 (MIRALAX PO) Take 1 capsule by mouth as needed (as directed). Patient states he takes one capful of Miralax as needed.     Resveratrol 100 MG CAPS Take by mouth.     tadalafil (CIALIS) 10 MG tablet Take by mouth.     Wheat Dextrin (BENEFIBER PO) Take 10 g/mL by mouth as needed (daily as needed). Patient states that he takes 2 tsps of benefiber as needed.     levothyroxine (SYNTHROID, LEVOTHROID) 25 MCG tablet Take 25 mcg by mouth daily before breakfast.     ondansetron (ZOFRAN-ODT) 4 MG disintegrating tablet Take 4 mg by mouth once.     Red Yeast Rice 55 MG CAPS Take 2-3 capsules by mouth daily.     Testosterone 20.25 MG/1.25GM (1.62%) GEL Apply 1 Act topically daily. 112.5 g 1   atenolol (TENORMIN) 25 MG tablet Take 1 tablet (25 mg total) by mouth daily. (Patient taking differently: Take 50 mg by mouth daily.) 90 tablet 1  Buprenorphine ER (SUBLOCADE) 100 MG/0.5ML SOSY Inject 100 mg into the skin every 30 (thirty) days.     doxepin (SINEQUAN) 10 MG capsule TAKE TWO CAPSULES BY MOUTH AT BEDTIME (Patient not taking: Reported on 04/16/2023) 180 capsule 0   finasteride (PROSCAR) 5 MG tablet Take 5 mg by mouth as needed.     lidocaine-prilocaine (EMLA) cream Apply 1 Application topically as needed. (Patient not taking: Reported on 04/16/2023)     METAMUCIL FIBER PO Take by mouth. (Patient not taking: Reported on 01/15/2023)     omeprazole (PRILOSEC) 20 MG capsule Take 1 capsule (20 mg total) by  mouth daily. (Patient not taking: Reported on 01/15/2023) 60 capsule 0   0.9 %  sodium chloride infusion      No facility-administered medications prior to visit.    ROS Review of Systems  Constitutional: Negative.  Negative for appetite change, diaphoresis, fatigue and unexpected weight change.  HENT: Negative.    Eyes: Negative.   Respiratory: Negative.  Negative for cough, chest tightness, shortness of breath and wheezing.   Cardiovascular:  Negative for chest pain, palpitations and leg swelling.  Gastrointestinal: Negative.  Negative for abdominal pain, constipation, diarrhea, nausea and vomiting.  Genitourinary: Negative.   Musculoskeletal:  Positive for arthralgias. Negative for back pain and myalgias.  Neurological: Negative.  Negative for dizziness, weakness, light-headedness and headaches.  Hematological:  Negative for adenopathy. Does not bruise/bleed easily.  Psychiatric/Behavioral: Negative.      Objective:  BP (!) 144/78 (BP Location: Left Arm, Patient Position: Sitting, Cuff Size: Large)   Pulse 67   Temp (!) 97.5 F (36.4 C) (Oral)   Ht 5' 8.9" (1.75 m)   Wt 205 lb 12.8 oz (93.4 kg)   SpO2 92%   BMI 30.48 kg/m   BP Readings from Last 3 Encounters:  04/16/23 (!) 144/78  01/15/23 120/78  12/26/22 (!) 176/86    Wt Readings from Last 3 Encounters:  04/16/23 205 lb 12.8 oz (93.4 kg)  01/15/23 201 lb 9.6 oz (91.4 kg)  12/26/22 206 lb (93.4 kg)    Physical Exam Vitals reviewed.  Constitutional:      Appearance: Normal appearance.  HENT:     Nose: Nose normal.     Mouth/Throat:     Mouth: Mucous membranes are moist.  Eyes:     General: No scleral icterus.    Conjunctiva/sclera: Conjunctivae normal.  Cardiovascular:     Rate and Rhythm: Normal rate and regular rhythm.     Heart sounds: No murmur heard.    No friction rub. No gallop.  Pulmonary:     Effort: Pulmonary effort is normal.     Breath sounds: No stridor. No wheezing, rhonchi or rales.   Abdominal:     General: Abdomen is flat.     Palpations: There is no mass.     Tenderness: There is no abdominal tenderness. There is no guarding.     Hernia: No hernia is present.  Musculoskeletal:        General: Normal range of motion.     Cervical back: Neck supple.     Right lower leg: No edema.     Left lower leg: No edema.  Lymphadenopathy:     Cervical: No cervical adenopathy.  Skin:    General: Skin is warm and dry.  Neurological:     General: No focal deficit present.     Mental Status: He is alert. Mental status is at baseline.  Psychiatric:  Mood and Affect: Mood normal.        Behavior: Behavior normal.     Lab Results  Component Value Date   WBC 4.2 04/16/2023   HGB 18.1 Repeated and verified X2. (HH) 04/16/2023   HCT 54.4 Repeated and verified X2. (H) 04/16/2023   PLT 201.0 04/16/2023   GLUCOSE 80 04/16/2023   CHOL 180 09/19/2022   TRIG 141.0 09/19/2022   HDL 48.60 09/19/2022   LDLCALC 104 (H) 09/19/2022   ALT 26 01/15/2023   AST 21 01/15/2023   NA 143 04/16/2023   K 4.2 04/16/2023   CL 99 04/16/2023   CREATININE 1.68 (H) 04/16/2023   BUN 31 (H) 04/16/2023   CO2 36 (H) 04/16/2023   TSH 2.10 04/16/2023   PSA 1.65 04/16/2023    CT Abdomen Pelvis W Contrast Result Date: 05/07/2021 CLINICAL DATA:  Unintended weight loss EXAM: CT ABDOMEN AND PELVIS WITH CONTRAST TECHNIQUE: Multidetector CT imaging of the abdomen and pelvis was performed using the standard protocol following bolus administration of intravenous contrast. RADIATION DOSE REDUCTION: This exam was performed according to the departmental dose-optimization program which includes automated exposure control, adjustment of the mA and/or kV according to patient size and/or use of iterative reconstruction technique. CONTRAST:  ISOVUE-300 IOPAMIDOL (ISOVUE-300) INJECTION 61% COMPARISON:  10/19/2015 FINDINGS: Lower chest: No acute abnormality.  Small hiatal hernia. Hepatobiliary: Numerous  low-density lesions throughout the liver, the largest in the right hepatic lobe measuring 8.2 cm compared with 7 cm previously. These appear to reflect simple cysts. Small layering stones in the gallbladder. Pancreas: No focal abnormality or ductal dilatation. Spleen: No focal abnormality.  Normal size. Adrenals/Urinary Tract: 1.8 cm low-density lesion in the midpole of the left kidney appears to reflect a simple cyst. 1.8 cm low-density lesion in the midpole of the right kidney also appears to reflect a simple cyst. No hydronephrosis. Adrenal glands and urinary bladder unremarkable. Stomach/Bowel: Sigmoid diverticulosis. No active diverticulitis. Stomach and small bowel decompressed, unremarkable. Duodenal diverticulum in the 2/3 portion of the duodenum measures 3.7 cm. Vascular/Lymphatic: Aortic atherosclerosis. No evidence of aneurysm or adenopathy. Reproductive: No visible focal abnormality.  Penile implant noted. Other: No free fluid or free air. Musculoskeletal: No acute bony abnormality. Degenerative changes in the lumbar spine. Bilateral L5 pars defects noted. Subcutaneous soft tissue nodule along the skin surface in the left lateral abdominal wall measures 2.7 cm compared to 1.7 cm on prior study. This most likely reflects sebaceous cyst. IMPRESSION: Numerous low-density lesions throughout the liver measuring up to 8.2 cm. These appear to reflect simple cysts. Bilateral renal low-density lesions most compatible with cysts. Cholelithiasis. Sigmoid diverticulosis.  No active diverticulitis. Aortic atherosclerosis. No acute findings. Electronically Signed   By: Charlett Nose M.D.   On: 05/07/2021 23:22    Assessment & Plan:   Primary hypertension- His blood pressure is well. -     CBC with Differential/Platelet; Future -     Basic metabolic panel; Future  Hypogonadism male- He has thrombocytosis and his testosterone level is high.  Will discontinue the testosterone supplement. -     Testosterone  Total,Free,Bio, Males; Future -     CBC with Differential/Platelet; Future  Chronic idiopathic constipation- Labs are negative for secondary causes. -     TSH; Future -     Basic metabolic panel; Future -     Magnesium; Future  Benign prostatic hyperplasia without lower urinary tract symptoms -     PSA; Future  Dyslipidemia, goal LDL below 100-  I have asked him to take a statin for cardiovascular risk reduction. -     CK; Future  Need for hepatitis C screening test -     Hepatitis C antibody; Future  Stage 3b chronic kidney disease (HCC)- Will avoid nephrotoxic agents      Follow-up: Return in about 6 months (around 10/14/2023).  Sanda Linger, MD

## 2023-04-16 NOTE — Telephone Encounter (Signed)
CRITICAL VALUE STICKER  CRITICAL VALUE: HBG 18.1   RECEIVER (on-site recipient of call): [employer]  DATE & TIME NOTIFIED: 04/16/2023 at 12:31PM   MESSENGER (representative from lab): [employer].   MD NOTIFIED: Yes

## 2023-04-17 LAB — TESTOSTERONE TOTAL,FREE,BIO, MALES
Albumin: 4.2 g/dL (ref 3.6–5.1)
Sex Hormone Binding: 44 nmol/L (ref 22–77)
Testosterone, Bioavailable: 266.1 ng/dL — ABNORMAL HIGH (ref 15.0–150.0)
Testosterone, Free: 138.1 pg/mL — ABNORMAL HIGH (ref 6.0–73.0)
Testosterone: 1088 ng/dL — ABNORMAL HIGH (ref 250–827)

## 2023-04-17 LAB — HEPATITIS C ANTIBODY: Hepatitis C Ab: NONREACTIVE

## 2023-04-17 MED ORDER — ATORVASTATIN CALCIUM 10 MG PO TABS: 10.0000 mg | ORAL_TABLET | Freq: Every day | ORAL | 1 refills | Status: AC

## 2023-05-12 ENCOUNTER — Other Ambulatory Visit: Payer: Self-pay | Admitting: Gastroenterology

## 2023-05-21 DIAGNOSIS — M1712 Unilateral primary osteoarthritis, left knee: Secondary | ICD-10-CM | POA: Diagnosis not present

## 2023-07-11 ENCOUNTER — Other Ambulatory Visit: Payer: Self-pay | Admitting: Gastroenterology

## 2023-07-16 LAB — COMPREHENSIVE METABOLIC PANEL WITH GFR
Calcium: 9.1 (ref 8.7–10.7)
eGFR: 51

## 2023-07-16 LAB — BASIC METABOLIC PANEL WITH GFR
BUN: 21 (ref 4–21)
CO2: 28 — AB (ref 13–22)
Chloride: 109 — AB (ref 99–108)
Creatinine: 1.4 — AB (ref 0.6–1.3)
Glucose: 94
Potassium: 4 meq/L (ref 3.5–5.1)
Sodium: 145 (ref 137–147)

## 2023-07-16 LAB — CBC AND DIFFERENTIAL
HCT: 49 (ref 41–53)
Hemoglobin: 16.3 (ref 13.5–17.5)
Platelets: 214 K/uL (ref 150–400)
WBC: 5

## 2023-10-13 ENCOUNTER — Ambulatory Visit (INDEPENDENT_AMBULATORY_CARE_PROVIDER_SITE_OTHER): Payer: No Typology Code available for payment source | Admitting: Internal Medicine

## 2023-10-13 ENCOUNTER — Encounter: Payer: Self-pay | Admitting: Internal Medicine

## 2023-10-13 VITALS — BP 130/70 | HR 77 | Temp 98.0°F | Ht 68.9 in | Wt 191.4 lb

## 2023-10-13 DIAGNOSIS — M25562 Pain in left knee: Secondary | ICD-10-CM | POA: Insufficient documentation

## 2023-10-13 DIAGNOSIS — Z0000 Encounter for general adult medical examination without abnormal findings: Secondary | ICD-10-CM

## 2023-10-13 DIAGNOSIS — Z860100 Personal history of colon polyps, unspecified: Secondary | ICD-10-CM | POA: Insufficient documentation

## 2023-10-13 DIAGNOSIS — E039 Hypothyroidism, unspecified: Secondary | ICD-10-CM | POA: Diagnosis not present

## 2023-10-13 DIAGNOSIS — B3781 Candidal esophagitis: Secondary | ICD-10-CM | POA: Insufficient documentation

## 2023-10-13 DIAGNOSIS — I10 Essential (primary) hypertension: Secondary | ICD-10-CM | POA: Diagnosis not present

## 2023-10-13 DIAGNOSIS — K5900 Constipation, unspecified: Secondary | ICD-10-CM | POA: Insufficient documentation

## 2023-10-13 DIAGNOSIS — N1832 Chronic kidney disease, stage 3b: Secondary | ICD-10-CM

## 2023-10-13 DIAGNOSIS — Z0001 Encounter for general adult medical examination with abnormal findings: Secondary | ICD-10-CM

## 2023-10-13 NOTE — Progress Notes (Unsigned)
 Subjective:  Patient ID: Daniel Marquez, male    DOB: 1945/11/21  Age: 78 y.o. MRN: [id number]  CC: Annual Exam and Hypertension   HPI [name] presents for a CPX and f/up ----  Discussed the use of AI scribe software for clinical note transcription with the patient, who gave verbal consent to proceed.  History of Present Illness Daniel Marquez is a 78 year old male who presents for follow-up regarding knee instability and urinary symptoms.  He experiences knee instability, leading to imbalance and near-falls, though he has not had any recent falls. He believes the knee may require replacement but has been postponing this. He underwent a cardiac workup, including an EKG and a stress test, both normal, when considering knee replacement. He recalls being told of a past heart attack, but subsequent tests showed no issues. His last EKG was two to three years ago. No current chest pain, shortness of breath, or heart attack symptoms.  He experiences urinary urgency, occasional leakage, and sometimes painful urination. He urinates multiple times at night, ranging from once to four times. He takes tadalafil 5 mg daily and tamsulosin for these symptoms.  He takes a lower dose of testosterone  supplementation and continues on levothyroxine. No symptoms of thyroid  dose being too high or too low. His creatinine levels have been abnormal, which he attributes to training and fluid intake. He has been advised to see a kidney specialist.   He has his labs done at the [facility].    Outpatient Medications Prior to Visit  Medication Sig Dispense Refill   atorvastatin  (LIPITOR) 10 MG tablet Take 1 tablet (10 mg total) by mouth daily. 90 tablet 1   cimetidine (TAGAMET) 200 MG tablet Take 200 mg by mouth as needed.     EPINEPHrine  0.3 MG/0.3ML SOSY Inject 0.3 mg into the muscle as needed for anaphylaxis. 1 each 1   fexofenadine  (ALLEGRA ) 180 MG tablet Take 1 tablet (180 mg total) by mouth daily. 30 tablet  3   hydrocortisone cream 1 % Apply 1 Application topically as needed for itching.     ipratropium (ATROVENT ) 0.03 % nasal spray 1 spray each nostril up to 3 Times daily. 30 mL 3   losartan-hydrochlorothiazide (HYZAAR) 100-25 MG tablet Take 1 tablet by mouth daily.     Multiple Vitamin (MULTI-VITAMIN DAILY PO) Take by mouth.     Omega-3 Fatty Acids (FISH OIL) 1000 MG CAPS Take 1,000 capsules by mouth.     omeprazole  (PRILOSEC) 20 MG capsule TAKE ONE CAPSULE BY MOUTH ONE TIME DAILY 60 capsule 0   Polyethylene Glycol 3350 (MIRALAX PO) Take 1 capsule by mouth as needed (as directed). Patient states he takes one capful of Miralax as needed.     Resveratrol 100 MG CAPS Take by mouth.     tadalafil (CIALIS) 10 MG tablet Take by mouth.     Wheat Dextrin (BENEFIBER PO) Take 10 g/mL by mouth as needed (daily as needed). Patient states that he takes 2 tsps of benefiber as needed.     No facility-administered medications prior to visit.    ROS Review of Systems  Musculoskeletal:  Positive for arthralgias. Negative for myalgias.  Hematological:  Negative for adenopathy. Does not bruise/bleed easily.  Psychiatric/Behavioral: Negative.      Objective:  BP 130/70 (BP Location: Left Arm, Patient Position: Sitting, Cuff Size: Normal)   Pulse 77   Temp 98 F (36.7 C) (Oral)   Ht 5' 8.9 (1.75 m)  Wt 191 lb 6.4 oz (86.8 kg)   SpO2 98%   BMI 28.35 kg/m   BP Readings from Last 3 Encounters:  10/13/23 130/70  04/16/23 (!) 144/78  01/15/23 120/78    Wt Readings from Last 3 Encounters:  10/13/23 191 lb 6.4 oz (86.8 kg)  04/16/23 205 lb 12.8 oz (93.4 kg)  01/15/23 201 lb 9.6 oz (91.4 kg)    Physical Exam Vitals reviewed.  HENT:     Mouth/Throat:     Mouth: Mucous membranes are moist.  Eyes:     General: No scleral icterus.    Conjunctiva/sclera: Conjunctivae normal.  Cardiovascular:     Rate and Rhythm: Normal rate and regular rhythm.     Heart sounds: No murmur heard.    No friction  rub. No gallop.     Comments: EKG--- SR with 1st degree AV block, 76 bpm Isolated Q in III No LVH No old EKG's to compare Pulmonary:     Effort: Pulmonary effort is normal.     Breath sounds: No stridor. No wheezing, rhonchi or rales.  Abdominal:     General: Abdomen is flat.     Palpations: There is no mass.     Tenderness: There is no abdominal tenderness. There is no guarding.     Hernia: No hernia is present.  Musculoskeletal:        General: Normal range of motion.     Cervical back: Neck supple.     Right lower leg: No edema.     Left lower leg: No edema.  Lymphadenopathy:     Cervical: No cervical adenopathy.  Neurological:     General: No focal deficit present.     Mental Status: He is alert. Mental status is at baseline.     Lab Results  Component Value Date   WBC 5.0 07/16/2023   HGB 16.3 07/16/2023   HCT 49 07/16/2023   PLT 214 07/16/2023   GLUCOSE 80 04/16/2023   CHOL 180 09/19/2022   TRIG 141.0 09/19/2022   HDL 48.60 09/19/2022   LDLCALC 104 (H) 09/19/2022   ALT 26 01/15/2023   AST 21 01/15/2023   NA 145 07/16/2023   K 4.0 07/16/2023   CL 109 (A) 07/16/2023   CREATININE 1.4 (A) 07/16/2023   BUN 21 07/16/2023   CO2 28 (A) 07/16/2023   TSH 2.10 04/16/2023   PSA 1.65 04/16/2023    CT Abdomen Pelvis W Contrast Result Date: 05/07/2021 CLINICAL DATA:  Unintended weight loss EXAM: CT ABDOMEN AND PELVIS WITH CONTRAST TECHNIQUE: Multidetector CT imaging of the abdomen and pelvis was performed using the standard protocol following bolus administration of intravenous contrast. RADIATION DOSE REDUCTION: This exam was performed according to the departmental dose-optimization program which includes automated exposure control, adjustment of the mA and/or kV according to patient size and/or use of iterative reconstruction technique. CONTRAST:  ISOVUE -300 IOPAMIDOL  (ISOVUE -300) INJECTION 61% COMPARISON:  10/19/2015 FINDINGS: Lower chest: No acute abnormality.   Small hiatal hernia. Hepatobiliary: Numerous low-density lesions throughout the liver, the largest in the right hepatic lobe measuring 8.2 cm compared with 7 cm previously. These appear to reflect simple cysts. Small layering stones in the gallbladder. Pancreas: No focal abnormality or ductal dilatation. Spleen: No focal abnormality.  Normal size. Adrenals/Urinary Tract: 1.8 cm low-density lesion in the midpole of the left kidney appears to reflect a simple cyst. 1.8 cm low-density lesion in the midpole of the right kidney also appears to reflect a simple cyst. No hydronephrosis.  Adrenal glands and urinary bladder unremarkable. Stomach/Bowel: Sigmoid diverticulosis. No active diverticulitis. Stomach and small bowel decompressed, unremarkable. Duodenal diverticulum in the 2/3 portion of the duodenum measures 3.7 cm. Vascular/Lymphatic: Aortic atherosclerosis. No evidence of aneurysm or adenopathy. Reproductive: No visible focal abnormality.  Penile implant noted. Other: No free fluid or free air. Musculoskeletal: No acute bony abnormality. Degenerative changes in the lumbar spine. Bilateral L5 pars defects noted. Subcutaneous soft tissue nodule along the skin surface in the left lateral abdominal wall measures 2.7 cm compared to 1.7 cm on prior study. This most likely reflects sebaceous cyst. IMPRESSION: Numerous low-density lesions throughout the liver measuring up to 8.2 cm. These appear to reflect simple cysts. Bilateral renal low-density lesions most compatible with cysts. Cholelithiasis. Sigmoid diverticulosis.  No active diverticulitis. Aortic atherosclerosis. No acute findings. Electronically Signed   By: Franky Crease M.D.   On: 05/07/2021 23:22    Assessment & Plan:  Primary hypertension -     EKG 12-Lead     Follow-up: Return in about 6 months (around 04/14/2024).  Debby Molt, MD

## 2023-10-13 NOTE — Patient Instructions (Signed)
 Health Maintenance, Male  Adopting a healthy lifestyle and getting preventive care are important in promoting health and wellness. Ask your health care provider about:  The right schedule for you to have regular tests and exams.  Things you can do on your own to prevent diseases and keep yourself healthy.  What should I know about diet, weight, and exercise?  Eat a healthy diet    Eat a diet that includes plenty of vegetables, fruits, low-fat dairy products, and lean protein.  Do not eat a lot of foods that are high in solid fats, added sugars, or sodium.  Maintain a healthy weight  Body mass index (BMI) is a measurement that can be used to identify possible weight problems. It estimates body fat based on height and weight. Your health care provider can help determine your BMI and help you achieve or maintain a healthy weight.  Get regular exercise  Get regular exercise. This is one of the most important things you can do for your health. Most adults should:  Exercise for at least 150 minutes each week. The exercise should increase your heart rate and make you sweat (moderate-intensity exercise).  Do strengthening exercises at least twice a week. This is in addition to the moderate-intensity exercise.  Spend less time sitting. Even light physical activity can be beneficial.  Watch cholesterol and blood lipids  Have your blood tested for lipids and cholesterol at 78 years of age, then have this test every 5 years.  You may need to have your cholesterol levels checked more often if:  Your lipid or cholesterol levels are high.  You are older than 78 years of age.  You are at high risk for heart disease.  What should I know about cancer screening?  Many types of cancers can be detected early and may often be prevented. Depending on your health history and family history, you may need to have cancer screening at various ages. This may include screening for:  Colorectal cancer.  Prostate cancer.  Skin cancer.  Lung  cancer.  What should I know about heart disease, diabetes, and high blood pressure?  Blood pressure and heart disease  High blood pressure causes heart disease and increases the risk of stroke. This is more likely to develop in people who have high blood pressure readings or are overweight.  Talk with your health care provider about your target blood pressure readings.  Have your blood pressure checked:  Every 3-5 years if you are 9-95 years of age.  Every year if you are 85 years old or older.  If you are between the ages of 29 and 29 and are a current or former smoker, ask your health care provider if you should have a one-time screening for abdominal aortic aneurysm (AAA).  Diabetes  Have regular diabetes screenings. This checks your fasting blood sugar level. Have the screening done:  Once every three years after age 23 if you are at a normal weight and have a low risk for diabetes.  More often and at a younger age if you are overweight or have a high risk for diabetes.  What should I know about preventing infection?  Hepatitis B  If you have a higher risk for hepatitis B, you should be screened for this virus. Talk with your health care provider to find out if you are at risk for hepatitis B infection.  Hepatitis C  Blood testing is recommended for:  Everyone born from [birth year] through 1965.  Anyone  with known risk factors for hepatitis C.  Sexually transmitted infections (STIs)  You should be screened each year for STIs, including gonorrhea and chlamydia, if:  You are sexually active and are younger than 78 years of age.  You are older than 78 years of age and your health care provider tells you that you are at risk for this type of infection.  Your sexual activity has changed since you were last screened, and you are at increased risk for chlamydia or gonorrhea. Ask your health care provider if you are at risk.  Ask your health care provider about whether you are at high risk for HIV. Your health care provider  may recommend a prescription medicine to help prevent HIV infection. If you choose to take medicine to prevent HIV, you should first get tested for HIV. You should then be tested every 3 months for as long as you are taking the medicine.  Follow these instructions at home:  Alcohol use  Do not drink alcohol if your health care provider tells you not to drink.  If you drink alcohol:  Limit how much you have to 0-2 drinks a day.  Know how much alcohol is in your drink. In the [place] one drink equals one 12 oz bottle of beer (355 mL), one 5 oz glass of wine (148 mL), or one 1 oz glass of hard liquor (44 mL).  Lifestyle  Do not use any products that contain nicotine or tobacco. These products include cigarettes, chewing tobacco, and vaping devices, such as e-cigarettes. If you need help quitting, ask your health care provider.  Do not use street drugs.  Do not share needles.  Ask your health care provider for help if you need support or information about quitting drugs.  General instructions  Schedule regular health, dental, and eye exams.  Stay current with your vaccines.  Tell your health care provider if:  You often feel depressed.  You have ever been abused or do not feel safe at home.  Summary  Adopting a healthy lifestyle and getting preventive care are important in promoting health and wellness.  Follow your health care provider's instructions about healthy diet, exercising, and getting tested or screened for diseases.  Follow your health care provider's instructions on monitoring your cholesterol and blood pressure.  This information is not intended to replace advice given to you by your health care provider. Make sure you discuss any questions you have with your health care provider.  Document Revised: 07/30/2020 Document Reviewed: 07/30/2020  Elsevier Patient Education  2024 [employer].

## 2023-11-19 DIAGNOSIS — M51371 Other intervertebral disc degeneration, lumbosacral region with lower extremity pain only: Secondary | ICD-10-CM | POA: Diagnosis not present

## 2023-11-19 DIAGNOSIS — M25552 Pain in left hip: Secondary | ICD-10-CM | POA: Diagnosis not present

## 2023-11-19 DIAGNOSIS — M5386 Other specified dorsopathies, lumbar region: Secondary | ICD-10-CM | POA: Diagnosis not present

## 2023-11-19 DIAGNOSIS — M51361 Other intervertebral disc degeneration, lumbar region with lower extremity pain only: Secondary | ICD-10-CM | POA: Diagnosis not present

## 2023-11-19 DIAGNOSIS — Q72812 Congenital shortening of left lower limb: Secondary | ICD-10-CM | POA: Diagnosis not present

## 2023-11-19 DIAGNOSIS — M9903 Segmental and somatic dysfunction of lumbar region: Secondary | ICD-10-CM | POA: Diagnosis not present

## 2023-11-19 DIAGNOSIS — M4316 Spondylolisthesis, lumbar region: Secondary | ICD-10-CM | POA: Diagnosis not present

## 2023-11-19 DIAGNOSIS — M9904 Segmental and somatic dysfunction of sacral region: Secondary | ICD-10-CM | POA: Diagnosis not present

## 2023-11-19 DIAGNOSIS — M5116 Intervertebral disc disorders with radiculopathy, lumbar region: Secondary | ICD-10-CM | POA: Diagnosis not present

## 2023-11-19 DIAGNOSIS — M9905 Segmental and somatic dysfunction of pelvic region: Secondary | ICD-10-CM | POA: Diagnosis not present

## 2023-11-24 DIAGNOSIS — M9903 Segmental and somatic dysfunction of lumbar region: Secondary | ICD-10-CM | POA: Diagnosis not present

## 2023-11-24 DIAGNOSIS — M4316 Spondylolisthesis, lumbar region: Secondary | ICD-10-CM | POA: Diagnosis not present

## 2023-11-24 DIAGNOSIS — Q72812 Congenital shortening of left lower limb: Secondary | ICD-10-CM | POA: Diagnosis not present

## 2023-11-24 DIAGNOSIS — M5116 Intervertebral disc disorders with radiculopathy, lumbar region: Secondary | ICD-10-CM | POA: Diagnosis not present

## 2023-11-24 DIAGNOSIS — M25552 Pain in left hip: Secondary | ICD-10-CM | POA: Diagnosis not present

## 2023-11-24 DIAGNOSIS — M51361 Other intervertebral disc degeneration, lumbar region with lower extremity pain only: Secondary | ICD-10-CM | POA: Diagnosis not present

## 2023-11-24 DIAGNOSIS — M5386 Other specified dorsopathies, lumbar region: Secondary | ICD-10-CM | POA: Diagnosis not present

## 2023-11-24 DIAGNOSIS — M51371 Other intervertebral disc degeneration, lumbosacral region with lower extremity pain only: Secondary | ICD-10-CM | POA: Diagnosis not present

## 2023-11-24 DIAGNOSIS — M9904 Segmental and somatic dysfunction of sacral region: Secondary | ICD-10-CM | POA: Diagnosis not present

## 2023-11-24 DIAGNOSIS — M9905 Segmental and somatic dysfunction of pelvic region: Secondary | ICD-10-CM | POA: Diagnosis not present

## 2023-11-26 DIAGNOSIS — M51361 Other intervertebral disc degeneration, lumbar region with lower extremity pain only: Secondary | ICD-10-CM | POA: Diagnosis not present

## 2023-11-26 DIAGNOSIS — M9904 Segmental and somatic dysfunction of sacral region: Secondary | ICD-10-CM | POA: Diagnosis not present

## 2023-11-26 DIAGNOSIS — M9903 Segmental and somatic dysfunction of lumbar region: Secondary | ICD-10-CM | POA: Diagnosis not present

## 2023-11-26 DIAGNOSIS — M5386 Other specified dorsopathies, lumbar region: Secondary | ICD-10-CM | POA: Diagnosis not present

## 2023-11-26 DIAGNOSIS — M5116 Intervertebral disc disorders with radiculopathy, lumbar region: Secondary | ICD-10-CM | POA: Diagnosis not present

## 2023-11-26 DIAGNOSIS — M4316 Spondylolisthesis, lumbar region: Secondary | ICD-10-CM | POA: Diagnosis not present

## 2023-11-26 DIAGNOSIS — M9905 Segmental and somatic dysfunction of pelvic region: Secondary | ICD-10-CM | POA: Diagnosis not present

## 2023-11-26 DIAGNOSIS — M25552 Pain in left hip: Secondary | ICD-10-CM | POA: Diagnosis not present

## 2023-11-26 DIAGNOSIS — Q72812 Congenital shortening of left lower limb: Secondary | ICD-10-CM | POA: Diagnosis not present

## 2023-11-26 DIAGNOSIS — M51371 Other intervertebral disc degeneration, lumbosacral region with lower extremity pain only: Secondary | ICD-10-CM | POA: Diagnosis not present

## 2023-12-01 ENCOUNTER — Telehealth: Admitting: Physician Assistant

## 2023-12-01 ENCOUNTER — Encounter (INDEPENDENT_AMBULATORY_CARE_PROVIDER_SITE_OTHER): Payer: Self-pay

## 2023-12-01 ENCOUNTER — Telehealth: Payer: Self-pay | Admitting: Internal Medicine

## 2023-12-01 ENCOUNTER — Other Ambulatory Visit: Payer: Self-pay | Admitting: Internal Medicine

## 2023-12-01 ENCOUNTER — Encounter: Payer: Self-pay | Admitting: Internal Medicine

## 2023-12-01 DIAGNOSIS — Q72812 Congenital shortening of left lower limb: Secondary | ICD-10-CM | POA: Diagnosis not present

## 2023-12-01 DIAGNOSIS — Z23 Encounter for immunization: Secondary | ICD-10-CM

## 2023-12-01 DIAGNOSIS — M4316 Spondylolisthesis, lumbar region: Secondary | ICD-10-CM | POA: Diagnosis not present

## 2023-12-01 DIAGNOSIS — M9905 Segmental and somatic dysfunction of pelvic region: Secondary | ICD-10-CM | POA: Diagnosis not present

## 2023-12-01 DIAGNOSIS — M9903 Segmental and somatic dysfunction of lumbar region: Secondary | ICD-10-CM | POA: Diagnosis not present

## 2023-12-01 DIAGNOSIS — M51371 Other intervertebral disc degeneration, lumbosacral region with lower extremity pain only: Secondary | ICD-10-CM | POA: Diagnosis not present

## 2023-12-01 DIAGNOSIS — M25552 Pain in left hip: Secondary | ICD-10-CM | POA: Diagnosis not present

## 2023-12-01 DIAGNOSIS — M5116 Intervertebral disc disorders with radiculopathy, lumbar region: Secondary | ICD-10-CM | POA: Diagnosis not present

## 2023-12-01 DIAGNOSIS — M5386 Other specified dorsopathies, lumbar region: Secondary | ICD-10-CM | POA: Diagnosis not present

## 2023-12-01 DIAGNOSIS — M51361 Other intervertebral disc degeneration, lumbar region with lower extremity pain only: Secondary | ICD-10-CM | POA: Diagnosis not present

## 2023-12-01 DIAGNOSIS — M9904 Segmental and somatic dysfunction of sacral region: Secondary | ICD-10-CM | POA: Diagnosis not present

## 2023-12-01 MED ORDER — COVID-19 MRNA VAC-TRIS(PFIZER) 30 MCG/0.3ML IM SUSY: 0.3000 mL | PREFILLED_SYRINGE | Freq: Once | INTRAMUSCULAR | 0 refills | Status: AC

## 2023-12-01 NOTE — Telephone Encounter (Signed)
 Patient was told that he needs a prescription for a Covid vaccine. He would like a call back at [phone].

## 2023-12-01 NOTE — Progress Notes (Signed)
 Please contact your primary care physician practice to be seen. Many offices offer virtual options to be seen via video if you are not comfortable going in person to a medical facility at this time.  NOTE: You will NOT be charged for this eVisit.  If you do not have a PCP, [facility] offers a free physician referral service available at [phone]. Our trained staff has the experience, knowledge and resources to put you in touch with a physician who is right for you.    If you are having a true medical emergency please call 911.   Your e-visit answers were reviewed by a board certified advanced clinical practitioner to complete your personal care plan.  Thank you for using e-Visits.

## 2023-12-02 DIAGNOSIS — M9903 Segmental and somatic dysfunction of lumbar region: Secondary | ICD-10-CM | POA: Diagnosis not present

## 2023-12-02 DIAGNOSIS — M9904 Segmental and somatic dysfunction of sacral region: Secondary | ICD-10-CM | POA: Diagnosis not present

## 2023-12-02 DIAGNOSIS — Q72812 Congenital shortening of left lower limb: Secondary | ICD-10-CM | POA: Diagnosis not present

## 2023-12-02 DIAGNOSIS — M4316 Spondylolisthesis, lumbar region: Secondary | ICD-10-CM | POA: Diagnosis not present

## 2023-12-02 DIAGNOSIS — M5386 Other specified dorsopathies, lumbar region: Secondary | ICD-10-CM | POA: Diagnosis not present

## 2023-12-02 DIAGNOSIS — M51361 Other intervertebral disc degeneration, lumbar region with lower extremity pain only: Secondary | ICD-10-CM | POA: Diagnosis not present

## 2023-12-02 DIAGNOSIS — M9905 Segmental and somatic dysfunction of pelvic region: Secondary | ICD-10-CM | POA: Diagnosis not present

## 2023-12-02 DIAGNOSIS — M51371 Other intervertebral disc degeneration, lumbosacral region with lower extremity pain only: Secondary | ICD-10-CM | POA: Diagnosis not present

## 2023-12-02 DIAGNOSIS — M25552 Pain in left hip: Secondary | ICD-10-CM | POA: Diagnosis not present

## 2023-12-02 DIAGNOSIS — M5116 Intervertebral disc disorders with radiculopathy, lumbar region: Secondary | ICD-10-CM | POA: Diagnosis not present

## 2023-12-04 ENCOUNTER — Other Ambulatory Visit: Payer: Self-pay

## 2023-12-04 DIAGNOSIS — Z23 Encounter for immunization: Secondary | ICD-10-CM

## 2023-12-04 MED ORDER — COVID-19 MRNA VAC-TRIS(PFIZER) 30 MCG/0.3ML IM SUSY: 0.3000 mL | PREFILLED_SYRINGE | Freq: Once | INTRAMUSCULAR | 0 refills | Status: AC

## 2023-12-04 NOTE — Telephone Encounter (Signed)
 RS has been sent in and patient has been made aware via Voicemail

## 2023-12-08 DIAGNOSIS — M4316 Spondylolisthesis, lumbar region: Secondary | ICD-10-CM | POA: Diagnosis not present

## 2023-12-08 DIAGNOSIS — M9903 Segmental and somatic dysfunction of lumbar region: Secondary | ICD-10-CM | POA: Diagnosis not present

## 2023-12-08 DIAGNOSIS — M5116 Intervertebral disc disorders with radiculopathy, lumbar region: Secondary | ICD-10-CM | POA: Diagnosis not present

## 2023-12-08 DIAGNOSIS — M51361 Other intervertebral disc degeneration, lumbar region with lower extremity pain only: Secondary | ICD-10-CM | POA: Diagnosis not present

## 2023-12-08 DIAGNOSIS — Q72812 Congenital shortening of left lower limb: Secondary | ICD-10-CM | POA: Diagnosis not present

## 2023-12-08 DIAGNOSIS — M5386 Other specified dorsopathies, lumbar region: Secondary | ICD-10-CM | POA: Diagnosis not present

## 2023-12-08 DIAGNOSIS — M9905 Segmental and somatic dysfunction of pelvic region: Secondary | ICD-10-CM | POA: Diagnosis not present

## 2023-12-08 DIAGNOSIS — M51371 Other intervertebral disc degeneration, lumbosacral region with lower extremity pain only: Secondary | ICD-10-CM | POA: Diagnosis not present

## 2023-12-08 DIAGNOSIS — M9904 Segmental and somatic dysfunction of sacral region: Secondary | ICD-10-CM | POA: Diagnosis not present

## 2023-12-08 DIAGNOSIS — M25552 Pain in left hip: Secondary | ICD-10-CM | POA: Diagnosis not present

## 2023-12-10 DIAGNOSIS — M5386 Other specified dorsopathies, lumbar region: Secondary | ICD-10-CM | POA: Diagnosis not present

## 2023-12-10 DIAGNOSIS — M9905 Segmental and somatic dysfunction of pelvic region: Secondary | ICD-10-CM | POA: Diagnosis not present

## 2023-12-10 DIAGNOSIS — M9903 Segmental and somatic dysfunction of lumbar region: Secondary | ICD-10-CM | POA: Diagnosis not present

## 2023-12-10 DIAGNOSIS — M4316 Spondylolisthesis, lumbar region: Secondary | ICD-10-CM | POA: Diagnosis not present

## 2023-12-10 DIAGNOSIS — Q72812 Congenital shortening of left lower limb: Secondary | ICD-10-CM | POA: Diagnosis not present

## 2023-12-10 DIAGNOSIS — M9904 Segmental and somatic dysfunction of sacral region: Secondary | ICD-10-CM | POA: Diagnosis not present

## 2023-12-10 DIAGNOSIS — M51361 Other intervertebral disc degeneration, lumbar region with lower extremity pain only: Secondary | ICD-10-CM | POA: Diagnosis not present

## 2023-12-10 DIAGNOSIS — M51371 Other intervertebral disc degeneration, lumbosacral region with lower extremity pain only: Secondary | ICD-10-CM | POA: Diagnosis not present

## 2023-12-10 DIAGNOSIS — M5116 Intervertebral disc disorders with radiculopathy, lumbar region: Secondary | ICD-10-CM | POA: Diagnosis not present

## 2023-12-10 DIAGNOSIS — M25552 Pain in left hip: Secondary | ICD-10-CM | POA: Diagnosis not present

## 2023-12-15 DIAGNOSIS — M4316 Spondylolisthesis, lumbar region: Secondary | ICD-10-CM | POA: Diagnosis not present

## 2023-12-15 DIAGNOSIS — M9904 Segmental and somatic dysfunction of sacral region: Secondary | ICD-10-CM | POA: Diagnosis not present

## 2023-12-15 DIAGNOSIS — M51361 Other intervertebral disc degeneration, lumbar region with lower extremity pain only: Secondary | ICD-10-CM | POA: Diagnosis not present

## 2023-12-15 DIAGNOSIS — M25552 Pain in left hip: Secondary | ICD-10-CM | POA: Diagnosis not present

## 2023-12-15 DIAGNOSIS — M9903 Segmental and somatic dysfunction of lumbar region: Secondary | ICD-10-CM | POA: Diagnosis not present

## 2023-12-15 DIAGNOSIS — M5386 Other specified dorsopathies, lumbar region: Secondary | ICD-10-CM | POA: Diagnosis not present

## 2023-12-15 DIAGNOSIS — M51371 Other intervertebral disc degeneration, lumbosacral region with lower extremity pain only: Secondary | ICD-10-CM | POA: Diagnosis not present

## 2023-12-15 DIAGNOSIS — M5116 Intervertebral disc disorders with radiculopathy, lumbar region: Secondary | ICD-10-CM | POA: Diagnosis not present

## 2023-12-15 DIAGNOSIS — M9905 Segmental and somatic dysfunction of pelvic region: Secondary | ICD-10-CM | POA: Diagnosis not present

## 2023-12-15 DIAGNOSIS — Q72812 Congenital shortening of left lower limb: Secondary | ICD-10-CM | POA: Diagnosis not present

## 2023-12-17 DIAGNOSIS — M5386 Other specified dorsopathies, lumbar region: Secondary | ICD-10-CM | POA: Diagnosis not present

## 2023-12-17 DIAGNOSIS — M9904 Segmental and somatic dysfunction of sacral region: Secondary | ICD-10-CM | POA: Diagnosis not present

## 2023-12-17 DIAGNOSIS — M4316 Spondylolisthesis, lumbar region: Secondary | ICD-10-CM | POA: Diagnosis not present

## 2023-12-17 DIAGNOSIS — M9903 Segmental and somatic dysfunction of lumbar region: Secondary | ICD-10-CM | POA: Diagnosis not present

## 2023-12-17 DIAGNOSIS — M51361 Other intervertebral disc degeneration, lumbar region with lower extremity pain only: Secondary | ICD-10-CM | POA: Diagnosis not present

## 2023-12-17 DIAGNOSIS — M5116 Intervertebral disc disorders with radiculopathy, lumbar region: Secondary | ICD-10-CM | POA: Diagnosis not present

## 2023-12-17 DIAGNOSIS — Q72812 Congenital shortening of left lower limb: Secondary | ICD-10-CM | POA: Diagnosis not present

## 2023-12-17 DIAGNOSIS — M25552 Pain in left hip: Secondary | ICD-10-CM | POA: Diagnosis not present

## 2023-12-17 DIAGNOSIS — M9905 Segmental and somatic dysfunction of pelvic region: Secondary | ICD-10-CM | POA: Diagnosis not present

## 2023-12-17 DIAGNOSIS — M51371 Other intervertebral disc degeneration, lumbosacral region with lower extremity pain only: Secondary | ICD-10-CM | POA: Diagnosis not present

## 2024-01-05 DIAGNOSIS — M5136 Other intervertebral disc degeneration, lumbar region with discogenic back pain only: Secondary | ICD-10-CM | POA: Diagnosis not present

## 2024-04-08 ENCOUNTER — Other Ambulatory Visit

## 2024-04-08 ENCOUNTER — Encounter: Payer: Self-pay | Admitting: Internal Medicine

## 2024-04-08 ENCOUNTER — Ambulatory Visit: Admitting: Internal Medicine

## 2024-04-08 VITALS — BP 120/70 | HR 74 | Ht 68.0 in | Wt 200.0 lb

## 2024-04-08 DIAGNOSIS — D3501 Benign neoplasm of right adrenal gland: Secondary | ICD-10-CM | POA: Diagnosis not present

## 2024-04-08 NOTE — Progress Notes (Unsigned)
 "   Name: Daniel Marquez  MRN/ DOB: [id number], 09-Apr-1945    Age/ Sex: 79 y.o., male    PCP: Joshua Debby CROME, MD   Reason for Endocrinology Evaluation: Adrenal adenoma     Date of Initial Endocrinology Evaluation: 04/08/2024     HPI: [name] is a 79 y.o. male with a past medical history of HTN, hypogonadism, IBS. The patient presented for initial endocrinology clinic visit on 04/08/2024 for consultative assistance with his adrenal adenoma .     The patient does follow-up with [facility] specialist.  He did receive intra-articular dexamethasone  injection on March 29, 2024  Pt was noted with MRI for lower back pain,    Adrenal incidentaloma: Substantial weight gain-his weight tends to fluctuate, but overall stable.  Patient does endorse inability to lose weight Severe hypertension- no DM-no Anxiety attacks-had panic attacks as a child ,  has PTSD and irritability , sober since 1976 Cardiac arrhythmias- no  Palpitations- to some degree on atenolol  Fluid retention- occasional on the left due to prior sx  Hypokalemia- no  Has noted excessive sweating this year Has numbness of hands and feet as well as unsteadiness but no presyncope or dizziness per se Has noted increased hair growth, but he is on topical testosterone  through the [place] No chest pain Has chronic constipation  No chronic abdominal pain, he did have transient pain yesterday and took cimetidine No personal history of cancer No prior MVAs  Father had metastatic cancer of unknown origin.  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 He has been exposed to tobacco smoke. He has never used smokeless tobacco. He reports that he does not drink alcohol and does not use drugs. Family History: family history includes Angioedema in his sister; Cancer in his father; Diabetes in his father; Heart attack in his father; Hypertension in his father; Urticaria in his sister.   HOME MEDICATIONS: Allergies as of 04/08/2024       Reactions   Codeine Other (See Comments)   Syncope   Buprenorphine Er Hives, Itching, Swelling        Medication List        Accurate as of April 08, 2024 11:25 AM. If you have any questions, ask your nurse or doctor.          atenolol  100 MG tablet Commonly known as: TENORMIN  Take 100 mg by mouth daily.   atorvastatin  10 MG tablet Commonly known as: LIPITOR Take 1 tablet (10 mg total) by mouth daily.   BENEFIBER PO Take 10 g/mL by mouth as needed (daily as needed). Patient states that he takes 2 tsps of benefiber as needed.   cimetidine 200 MG tablet Commonly known  as: TAGAMET Take 200 mg by mouth as needed.   EPINEPHrine  0.3 MG/0.3ML Sosy Inject 0.3 mg into the muscle as needed for anaphylaxis.   fexofenadine  180 MG tablet Commonly known as: ALLEGRA  Take 1 tablet (180 mg total) by mouth daily.   Fish Oil 1000 MG Caps Take 1,000 capsules by mouth.   hydrocortisone cream 1 % Apply 1 Application topically as needed for itching.   ipratropium 0.03 % nasal spray Commonly known as: ATROVENT  1 spray each nostril up to 3 Times daily.   losartan-hydrochlorothiazide 100-25 MG tablet Commonly known as: HYZAAR Take 1 tablet by mouth daily.   MIRALAX PO Take 1 capsule by mouth as needed (as directed). Patient states he takes one capful of Miralax  as needed.   MULTI-VITAMIN DAILY PO Take by mouth.   omeprazole  20 MG capsule Commonly known as: PRILOSEC TAKE ONE CAPSULE BY MOUTH ONE TIME DAILY   Resveratrol 100 MG Caps Take by mouth.   tadalafil 10 MG tablet Commonly known as: CIALIS Take by mouth.          REVIEW OF SYSTEMS: A comprehensive ROS was conducted with the patient and is negative except as per HPI     OBJECTIVE:  VS: BP 120/70   Pulse 74   Ht 5' 8 (1.727 m)   Wt 200 lb (90.7 kg)   SpO2 95%   BMI 30.41 kg/m    Wt Readings from Last 3 Encounters:  04/08/24 200 lb (90.7 kg)  10/13/23 191 lb 6.4 oz (86.8 kg)  04/16/23 205 lb 12.8 oz (93.4 kg)     EXAM: General: Pt appears well and is in NAD  Eyes: External eye exam normal without stare, lid lag or exophthalmos.  EOM intact.  PERRL.  Neck: General: Supple without adenopathy. Thyroid : Thyroid  size normal.  No goiter or nodules appreciated. No thyroid  bruit.  Lungs: Clear with good BS bilat   Heart: Auscultation: RRR.  Abdomen: Soft, nontender  Extremities:  BL LE: No pretibial edema   Mental Status: Judgment, insight: Intact Orientation: Oriented to time, place, and person Mood and affect: No depression, anxiety, or agitation     DATA REVIEWED: ***    MRI W/Wo 01/18/2024  Indeterminate T2 hyperintense right adrenal lesion  1.8x1.3 cm, did not features to represent an adenoma by MRI , there are internal septations with possible enhancement on venous phase . The left unremarkable     CT W contrast 02/04/2024   Right adenal nodule measuring 2.2x1.4 cm, measures 31HFU on density in precontrast , 43 HFU in density on the portal venous phase and 56 HFU in density on delayed phase    ASSESSMENT/PLAN/RECOMMENDATIONS:   Right adrenal Adenoma :   - This was an incidental finding on MRI in October, 2025 at 1.8 cm in diameter. - CT scan was with contrast which showed high density, almost all solid adrenal masses benign or malignant  become denser with IV contrast there was no mention of a washout on CT report, which makes adrenal adenoma indeterminant - We discussed repeating adrenal imaging in 3 months - We will proceed with screening for pheochromocytoma and hyperaldosteronism, as well as DHEA-S and androstenedione - He understands I am unable to screen for Cushing syndrome as he just recently received dexamethasone  spine injection   Follow-up in 3 months    I spent 45 minutes preparing to see the patient by review of recent labs, imaging and procedures, obtaining and reviewing separately obtained history, communicating with the  patient/family or caregiver, ordering medications, tests or procedures, and documenting clinical information in the EHR including the differential Dx, treatment, and any further evaluation and other management    Signed electronically by: Stefano Redgie Butts, MD  [facility]  [facility] [address] [place] [employer], [place] [address] Phone: [phone] FAX: [phone]   CC: Joshua Debby CROME, MD [address] [place] [address] Phone: [phone] Fax: [phone]   Return to Endocrinology clinic as below: Future Appointments  Date Time Provider Department Center  04/14/2024  9:40 AM Joshua Debby CROME, MD [facility]  10/13/2024  9:40 AM Joshua Debby CROME, MD [facility]         "

## 2024-04-11 ENCOUNTER — Ambulatory Visit: Payer: Self-pay | Admitting: Internal Medicine

## 2024-04-14 ENCOUNTER — Ambulatory Visit: Admitting: Internal Medicine

## 2024-04-19 LAB — BASIC METABOLIC PANEL WITH GFR
BUN: 24 mg/dL (ref 7–25)
CO2: 29 mmol/L (ref 20–32)
Calcium: 8.8 mg/dL (ref 8.6–10.3)
Chloride: 101 mmol/L (ref 98–110)
Creat: 1.18 mg/dL (ref 0.70–1.28)
Glucose, Bld: 99 mg/dL (ref 65–99)
Potassium: 3.9 mmol/L (ref 3.5–5.3)
Sodium: 140 mmol/L (ref 135–146)
eGFR: 63 mL/min/{1.73_m2}

## 2024-04-19 LAB — ALDOSTERONE + RENIN ACTIVITY W/ RATIO
ALDO / PRA Ratio: 6.4 ratio (ref 0.9–28.9)
Aldosterone: 6 ng/dL
Renin Activity: 0.94 ng/mL/h (ref 0.25–5.82)

## 2024-04-19 LAB — CATECHOLAMINES, FRACTIONATED, PLASMA
Dopamine: 38 pg/mL — ABNORMAL HIGH
Epinephrine: 44 pg/mL
Norepinephrine: 571 pg/mL
Total Catecholamines: 653 pg/mL

## 2024-04-19 LAB — ANDROSTENEDIONE: Androstenedione: 65 ng/dL

## 2024-04-19 LAB — DHEA-SULFATE: DHEA-SO4: 18 ug/dL (ref 3–225)

## 2024-05-17 ENCOUNTER — Ambulatory Visit: Admitting: Internal Medicine

## 2024-07-07 ENCOUNTER — Ambulatory Visit: Admitting: Internal Medicine

## 2024-10-13 ENCOUNTER — Encounter: Admitting: Internal Medicine
